# Patient Record
Sex: Male | Born: 1990
Health system: Southern US, Community
[De-identification: ages and names within clinical notes are randomized; demographics above are authoritative.]

## PROBLEM LIST (undated history)

## (undated) DIAGNOSIS — M24411 Recurrent dislocation, right shoulder: Secondary | ICD-10-CM

## (undated) DIAGNOSIS — F419 Anxiety disorder, unspecified: Secondary | ICD-10-CM

## (undated) DIAGNOSIS — F329 Major depressive disorder, single episode, unspecified: Secondary | ICD-10-CM

## (undated) DIAGNOSIS — J45909 Unspecified asthma, uncomplicated: Secondary | ICD-10-CM

## (undated) DIAGNOSIS — F32A Depression, unspecified: Secondary | ICD-10-CM

## (undated) HISTORY — DX: Anxiety disorder, unspecified: F41.9

## (undated) HISTORY — DX: Major depressive disorder, single episode, unspecified: F32.9

## (undated) HISTORY — DX: Depression, unspecified: F32.A

---
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
## 2012-07-12 ENCOUNTER — Emergency Department (HOSPITAL_COMMUNITY)
Admission: EM | Admit: 2012-07-12 | Discharge: 2012-07-13 | Disposition: A | Discharge status: Home / Self Care | Payer: Self-pay | Attending: Emergency Medicine | Admitting: Emergency Medicine

## 2012-07-12 ENCOUNTER — Emergency Department (HOSPITAL_COMMUNITY): Payer: Self-pay

## 2012-07-12 ENCOUNTER — Encounter (HOSPITAL_COMMUNITY): Payer: Self-pay | Admitting: Emergency Medicine

## 2012-07-12 DIAGNOSIS — F17200 Nicotine dependence, unspecified, uncomplicated: Secondary | ICD-10-CM | POA: Insufficient documentation

## 2012-07-12 DIAGNOSIS — J069 Acute upper respiratory infection, unspecified: Secondary | ICD-10-CM | POA: Insufficient documentation

## 2012-07-12 DIAGNOSIS — J40 Bronchitis, not specified as acute or chronic: Secondary | ICD-10-CM | POA: Insufficient documentation

## 2012-07-12 HISTORY — DX: Unspecified asthma, uncomplicated: J45.909

## 2012-07-12 MED ORDER — ALBUTEROL SULFATE HFA 108 (90 BASE) MCG/ACT IN AERS
2.0000 | INHALATION_SPRAY | RESPIRATORY_TRACT | Status: AC
  Administered 2012-07-13: 2 via RESPIRATORY_TRACT
  Filled 2012-07-12: qty 6.7

## 2012-07-12 NOTE — ED Notes (Signed)
States he has run out of his inhaler and does not have any more medication.  Thinks weather change, and working outside, blowing leaves, dust etc has contributed.  States temp 99+ during day and goes away when he is back inside

## 2012-07-12 NOTE — ED Notes (Signed)
Patient c/o nasal congestion with cough x 1 week.  States had productive cough.

## 2012-07-13 MED ORDER — BENZONATATE 100 MG PO CAPS: 100.0000 mg | ORAL_CAPSULE | Freq: Three times a day (TID) | ORAL | Status: DC | PRN

## 2012-07-13 NOTE — ED Provider Notes (Signed)
History     CSN: [id number]  Arrival date & time 07/12/12  2225   First MD Initiated Contact with Patient 07/12/12 2307      Chief Complaint  Patient presents with  . Nasal Congestion  . Cough    HPI Pt was seen at 2330.   Per pt, c/o gradual onset and persistence of constant runny/stuffy nose, sinus congestion, and cough for the past 1 week.  Tmax "99."  Denies sore throat, no rash, no CP/SOB, no N/V/D, no abd pain.     Past Medical History  Diagnosis Date  . Asthma     History reviewed. No pertinent past surgical history.    History  Substance Use Topics  . Smoking status: Current Every Day Smoker  . Smokeless tobacco: Not on file  . Alcohol Use: Yes     Review of Systems ROS: Statement: All systems negative except as marked or noted in the HPI; Constitutional: Negative for fever and chills. ; ; Eyes: Negative for eye pain, redness and discharge. ; ; ENMT: Negative for ear pain, hoarseness, sore throat.  +nasal congestion, sinus pressure. ; ; Cardiovascular: Negative for chest pain, palpitations, diaphoresis, dyspnea and peripheral edema. ; ; Respiratory: +cough. Negative for wheezing and stridor. ; ; Gastrointestinal: Negative for nausea, vomiting, diarrhea, abdominal pain, blood in stool, hematemesis, jaundice and rectal bleeding. ; ; Genitourinary: Negative for dysuria, flank pain and hematuria. ; ; Musculoskeletal: Negative for back pain and neck pain. Negative for swelling and trauma.; ; Skin: Negative for pruritus, rash, abrasions, blisters, bruising and skin lesion.; ; Neuro: Negative for headache, lightheadedness and neck stiffness. Negative for weakness, altered level of consciousness , altered mental status, extremity weakness, paresthesias, involuntary movement, seizure and syncope.       Allergies  Penicillins  Home Medications  No current outpatient prescriptions on file.  BP 133/69  Pulse 80  Temp 98.3 F (36.8 C) (Oral)  Resp 22  Ht 5\' 7"  (1.702  m)  Wt 165 lb (74.844 kg)  BMI 25.84 kg/m2  SpO2 100%  Physical Exam 2335: Physical examination:  Nursing notes reviewed; Vital signs and O2 SAT reviewed;  Constitutional: Well developed, Well nourished, Well hydrated, In no acute distress; Head:  Normocephalic, atraumatic; Eyes: EOMI, PERRL, No scleral icterus; ENMT: TM's clear bilat.  +edemetous nasal turbinates bilat with clear rhinorrhea. Mouth and pharynx normal, Mucous membranes moist; Neck: Supple, Full range of motion, No lymphadenopathy; Cardiovascular: Regular rate and rhythm, No murmur, rub, or gallop; Respiratory: Breath sounds clear & equal bilaterally, No rales, rhonchi, wheezes.  Speaking full sentences with ease, Normal respiratory effort/excursion; Chest: Nontender, Movement normal;; Extremities: Pulses normal, No tenderness, No edema, No calf edema or asymmetry.; Neuro: AA&Ox3, Major CN grossly intact.  Speech clear. No gross focal motor or sensory deficits in extremities.; Skin: Color normal, Warm, Dry, no rash.   ED Course  Procedures    MDM  MDM Reviewed: nursing note and vitals Interpretation: x-ray     Dg Chest 2 View 07/13/2012  *RADIOLOGY REPORT*  Clinical Data: Cough with nasal congestion.  CHEST - 2 VIEW  Comparison: None.  Findings: Normal heart size with clear lung fields.  No bony abnormality.  IMPRESSION: Negative chest.   Original Report Authenticated By: Elsie Stain, M.D.       0045:   No pneumonia on CXR.  Will tx symptomatically for URI/bronchitis.  No wheezing on exam.  MDI given teach/treat here.  Lungs continue CTA bilat without wheezing, resps easy,  Sats 100% R/A.  Dx testing d/w pt.  Questions answered.  Verb understanding, agreeable to d/c home with outpt f/u.      Laray Anger, DO 07/16/12 1427

## 2014-07-28 ENCOUNTER — Emergency Department (HOSPITAL_COMMUNITY)
Admission: EM | Admit: 2014-07-28 | Discharge: 2014-07-29 | Disposition: A | Discharge status: Home / Self Care | Payer: No Typology Code available for payment source | Attending: Emergency Medicine | Admitting: Emergency Medicine

## 2014-07-28 ENCOUNTER — Emergency Department (HOSPITAL_COMMUNITY): Payer: No Typology Code available for payment source

## 2014-07-28 ENCOUNTER — Encounter (HOSPITAL_COMMUNITY): Payer: Self-pay | Admitting: Emergency Medicine

## 2014-07-28 DIAGNOSIS — M62838 Other muscle spasm: Secondary | ICD-10-CM | POA: Insufficient documentation

## 2014-07-28 DIAGNOSIS — J45909 Unspecified asthma, uncomplicated: Secondary | ICD-10-CM | POA: Insufficient documentation

## 2014-07-28 DIAGNOSIS — S161XXA Strain of muscle, fascia and tendon at neck level, initial encounter: Secondary | ICD-10-CM

## 2014-07-28 DIAGNOSIS — Z880 Allergy status to penicillin: Secondary | ICD-10-CM | POA: Insufficient documentation

## 2014-07-28 DIAGNOSIS — Z720 Tobacco use: Secondary | ICD-10-CM | POA: Insufficient documentation

## 2014-07-28 DIAGNOSIS — Y9389 Activity, other specified: Secondary | ICD-10-CM | POA: Insufficient documentation

## 2014-07-28 DIAGNOSIS — S134XXA Sprain of ligaments of cervical spine, initial encounter: Secondary | ICD-10-CM | POA: Insufficient documentation

## 2014-07-28 DIAGNOSIS — S0990XA Unspecified injury of head, initial encounter: Secondary | ICD-10-CM | POA: Insufficient documentation

## 2014-07-28 DIAGNOSIS — Y92410 Unspecified street and highway as the place of occurrence of the external cause: Secondary | ICD-10-CM | POA: Insufficient documentation

## 2014-07-28 MED ORDER — DIAZEPAM 5 MG PO TABS
5.0000 mg | ORAL_TABLET | Freq: Once | ORAL | Status: AC
  Administered 2014-07-29: 5 mg via ORAL
  Filled 2014-07-28: qty 1

## 2014-07-28 MED ORDER — KETOROLAC TROMETHAMINE 60 MG/2ML IM SOLN
60.0000 mg | Freq: Once | INTRAMUSCULAR | Status: AC
  Administered 2014-07-29: 60 mg via INTRAMUSCULAR
  Filled 2014-07-28: qty 2

## 2014-07-28 NOTE — ED Notes (Signed)
C-collar applied in triage.
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
## 2014-07-29 MED ORDER — DIAZEPAM 5 MG PO TABS: 5.0000 mg | ORAL_TABLET | Freq: Three times a day (TID) | ORAL | Status: DC | PRN

## 2014-07-29 MED ORDER — OXYCODONE-ACETAMINOPHEN 5-325 MG PO TABS
1.0000 | ORAL_TABLET | Freq: Once | ORAL | Status: AC
  Administered 2014-07-29: 1 via ORAL
  Filled 2014-07-29: qty 1

## 2014-07-29 MED ORDER — OXYCODONE-ACETAMINOPHEN 5-325 MG PO TABS: 1.0000 | ORAL_TABLET | ORAL | Status: DC | PRN

## 2014-07-29 MED ORDER — PREDNISONE 10 MG PO TABS: 40.0000 mg | ORAL_TABLET | Freq: Every day | ORAL | Status: AC

## 2014-07-29 NOTE — ED Notes (Signed)
Pt alert & oriented x4, stable gait. Patient given discharge instructions, paperwork & prescription(s). Patient  instructed to stop at the registration desk to finish any additional paperwork. Patient verbalized understanding. Pt left department w/ no further questions. 

## 2014-07-29 NOTE — ED Provider Notes (Signed)
Medical screening examination/treatment/procedure(s) were performed by non-physician practitioner and as supervising physician I was immediately available for consultation/collaboration.   EKG Interpretation None        Hanley SeamenJohn L Ellakate Gonsalves, MD 07/29/14 [phone]
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
## 2014-07-29 NOTE — Discharge Instructions (Signed)
Cervical Sprain °A cervical sprain is an injury in the neck in which the strong, fibrous tissues (ligaments) that connect your neck bones stretch or tear. Cervical sprains can range from mild to severe. Severe cervical sprains can cause the neck vertebrae to be unstable. This can lead to damage of the spinal cord and can result in serious nervous system problems. The amount of time it takes for a cervical sprain to get better depends on the cause and extent of the injury. Most cervical sprains heal in 1 to 3 weeks. °CAUSES  °Severe cervical sprains may be caused by:  °· Contact sport injuries (such as from football, rugby, wrestling, hockey, auto racing, gymnastics, diving, martial arts, or boxing).   °· Motor vehicle collisions.   °· Whiplash injuries. This is an injury from a sudden forward and backward whipping movement of the head and neck.  °· Falls.   °Mild cervical sprains may be caused by:  °· Being in an awkward position, such as while cradling a telephone between your ear and shoulder.   °· Sitting in a chair that does not offer proper support.   °· Working at a poorly designed computer station.   °· Looking up or down for long periods of time.   °SYMPTOMS  °· Pain, soreness, stiffness, or a burning sensation in the front, back, or sides of the neck. This discomfort may develop immediately after the injury or slowly, 24 hours or more after the injury.   °· Pain or tenderness directly in the middle of the back of the neck.   °· Shoulder or upper back pain.   °· Limited ability to move the neck.   °· Headache.   °· Dizziness.   °· Weakness, numbness, or tingling in the hands or arms.   °· Muscle spasms.   °· Difficulty swallowing or chewing.   °· Tenderness and swelling of the neck.   °DIAGNOSIS  °Most of the time your health care provider can diagnose a cervical sprain by taking your history and doing a physical exam. Your health care provider will ask about previous neck injuries and any known neck  problems, such as arthritis in the neck. X-rays may be taken to find out if there are any other problems, such as with the bones of the neck. Other tests, such as a CT scan or MRI, may also be needed.  °TREATMENT  °Treatment depends on the severity of the cervical sprain. Mild sprains can be treated with rest, keeping the neck in place (immobilization), and pain medicines. Severe cervical sprains are immediately immobilized. Further treatment is done to help with pain, muscle spasms, and other symptoms and may include: °· Medicines, such as pain relievers, numbing medicines, or muscle relaxants.   °· Physical therapy. This may involve stretching exercises, strengthening exercises, and posture training. Exercises and improved posture can help stabilize the neck, strengthen muscles, and help stop symptoms from returning.   °HOME CARE INSTRUCTIONS  °· Put ice on the injured area.   °¨ Put ice in a plastic bag.   °¨ Place a towel between your skin and the bag.   °¨ Leave the ice on for 15-20 minutes, 3-4 times a day.   °· If your injury was severe, you may have been given a cervical collar to wear. A cervical collar is a two-piece collar designed to keep your neck from moving while it heals. °¨ Do not remove the collar unless instructed by your health care provider. °¨ If you have long hair, keep it outside of the collar. °¨ Ask your health care provider before making any adjustments to your collar. Minor   adjustments may be required over time to improve comfort and reduce pressure on your chin or on the back of your head. °¨ If you are allowed to remove the collar for cleaning or bathing, follow your health care provider's instructions on how to do so safely. °¨ Keep your collar clean by wiping it with mild soap and water and drying it completely. If the collar you have been given includes removable pads, remove them every 1-2 days and hand wash them with soap and water. Allow them to air dry. They should be completely  dry before you wear them in the collar. °¨ If you are allowed to remove the collar for cleaning and bathing, wash and dry the skin of your neck. Check your skin for irritation or sores. If you see any, tell your health care provider. °¨ Do not drive while wearing the collar.   °· Only take over-the-counter or prescription medicines for pain, discomfort, or fever as directed by your health care provider.   °· Keep all follow-up appointments as directed by your health care provider.   °· Keep all physical therapy appointments as directed by your health care provider.   °· Make any needed adjustments to your workstation to promote good posture.   °· Avoid positions and activities that make your symptoms worse.   °· Warm up and stretch before being active to help prevent problems.   °SEEK MEDICAL CARE IF:  °· Your pain is not controlled with medicine.   °· You are unable to decrease your pain medicine over time as planned.   °· Your activity level is not improving as expected.   °SEEK IMMEDIATE MEDICAL CARE IF:  °· You develop any bleeding. °· You develop stomach upset. °· You have signs of an allergic reaction to your medicine.   °· Your symptoms get worse.   °· You develop new, unexplained symptoms.   °· You have numbness, tingling, weakness, or paralysis in any part of your body.   °MAKE SURE YOU:  °· Understand these instructions. °· Will watch your condition. °· Will get help right away if you are not doing well or get worse. °Document Released: 08/08/2007 Document Revised: 10/16/2013 Document Reviewed: 04/18/2013 °[employer]® Patient Information ©2015 [employer]. This information is not intended to replace advice given to you by your health care provider. Make sure you discuss any questions you have with your health care provider. ° °Motor Vehicle Collision °It is common to have multiple bruises and sore muscles after a motor vehicle collision (MVC). These tend to feel worse for the first 24 hours. You may have  the most stiffness and soreness over the first several hours. You may also feel worse when you wake up the first morning after your collision. After this point, you will usually begin to improve with each day. The speed of improvement often depends on the severity of the collision, the number of injuries, and the location and nature of these injuries. °HOME CARE INSTRUCTIONS °· Put ice on the injured area. °· Put ice in a plastic bag. °· Place a towel between your skin and the bag. °· Leave the ice on for 15-20 minutes, 3-4 times a day, or as directed by your health care provider. °· Drink enough fluids to keep your urine clear or pale yellow. Do not drink alcohol. °· Take a warm shower or bath once or twice a day. This will increase blood flow to sore muscles. °· You may return to activities as directed by your caregiver. Be careful when lifting, as this may aggravate neck or back   pain. °· Only take over-the-counter or prescription medicines for pain, discomfort, or fever as directed by your caregiver. Do not use aspirin. This may increase bruising and bleeding. °SEEK IMMEDIATE MEDICAL CARE IF: °· You have numbness, tingling, or weakness in the arms or legs. °· You develop severe headaches not relieved with medicine. °· You have severe neck pain, especially tenderness in the middle of the back of your neck. °· You have changes in bowel or bladder control. °· There is increasing pain in any area of the body. °· You have shortness of breath, light-headedness, dizziness, or fainting. °· You have chest pain. °· You feel sick to your stomach (nauseous), throw up (vomit), or sweat. °· You have increasing abdominal discomfort. °· There is blood in your urine, stool, or vomit. °· You have pain in your shoulder (shoulder strap areas). °· You feel your symptoms are getting worse. °MAKE SURE YOU: °· Understand these instructions. °· Will watch your condition. °· Will get help right away if you are not doing well or get  worse. °Document Released: 10/11/2005 Document Revised: 02/25/2014 Document Reviewed: 03/10/2011 °[employer]® Patient Information ©2015 [employer]. This information is not intended to replace advice given to you by your health care provider. Make sure you discuss any questions you have with your health care provider. ° °Muscle Strain °A muscle strain is an injury that occurs when a muscle is stretched beyond its normal length. Usually a small number of muscle fibers are torn when this happens. Muscle strain is rated in degrees. First-degree strains have the least amount of muscle fiber tearing and pain. Second-degree and third-degree strains have increasingly more tearing and pain.  °Usually, recovery from muscle strain takes 1-2 weeks. Complete healing takes 5-6 weeks.  °CAUSES  °Muscle strain happens when a sudden, violent force placed on a muscle stretches it too far. This may occur with lifting, sports, or a fall.  °RISK FACTORS °Muscle strain is especially common in athletes.  °SIGNS AND SYMPTOMS °At the site of the muscle strain, there may be: °· Pain. °· Bruising. °· Swelling. °· Difficulty using the muscle due to pain or lack of normal function. °DIAGNOSIS  °Your health care provider will perform a physical exam and ask about your medical history. °TREATMENT  °Often, the best treatment for a muscle strain is resting, icing, and applying cold compresses to the injured area.   °HOME CARE INSTRUCTIONS  °· Use the PRICE method of treatment to promote muscle healing during the first 2-3 days after your injury. The PRICE method involves: °¨ Protecting the muscle from being injured again. °¨ Restricting your activity and resting the injured body part. °¨ Icing your injury. To do this, put ice in a plastic bag. Place a towel between your skin and the bag. Then, apply the ice and leave it on from 15-20 minutes each hour. After the third day, switch to moist heat packs. °¨ Apply compression to the injured area with a  splint or elastic bandage. Be careful not to wrap it too tightly. This may interfere with blood circulation or increase swelling. °¨ Elevate the injured body part above the level of your heart as often as you can. °· Only take over-the-counter or prescription medicines for pain, discomfort, or fever as directed by your health care provider. °· Warming up prior to exercise helps to prevent future muscle strains. °SEEK MEDICAL CARE IF:  °· You have increasing pain or swelling in the injured area. °· You have numbness, tingling, or a   significant loss of strength in the injured area. °MAKE SURE YOU:  °· Understand these instructions. °· Will watch your condition. °· Will get help right away if you are not doing well or get worse. °Document Released: 10/11/2005 Document Revised: 08/01/2013 Document Reviewed: 05/10/2013 °[employer]® Patient Information ©2015 [employer]. This information is not intended to replace advice given to you by your health care provider. Make sure you discuss any questions you have with your health care provider. ° °

## 2015-04-23 ENCOUNTER — Encounter (HOSPITAL_COMMUNITY): Payer: Self-pay | Admitting: Emergency Medicine

## 2015-04-23 ENCOUNTER — Emergency Department (HOSPITAL_COMMUNITY)
Admission: EM | Admit: 2015-04-23 | Discharge: 2015-04-23 | Disposition: A | Discharge status: Home / Self Care | Payer: Self-pay | Attending: Emergency Medicine | Admitting: Emergency Medicine

## 2015-04-23 DIAGNOSIS — Z720 Tobacco use: Secondary | ICD-10-CM | POA: Insufficient documentation

## 2015-04-23 DIAGNOSIS — Z880 Allergy status to penicillin: Secondary | ICD-10-CM | POA: Insufficient documentation

## 2015-04-23 DIAGNOSIS — F419 Anxiety disorder, unspecified: Secondary | ICD-10-CM | POA: Insufficient documentation

## 2015-04-23 DIAGNOSIS — J45909 Unspecified asthma, uncomplicated: Secondary | ICD-10-CM | POA: Insufficient documentation

## 2015-04-23 MED ORDER — HYDROXYZINE HCL 25 MG PO TABS: 25.0000 mg | ORAL_TABLET | Freq: Four times a day (QID) | ORAL | Status: DC | PRN

## 2015-04-23 NOTE — ED Notes (Signed)
MD at bedside. 

## 2015-04-23 NOTE — ED Notes (Signed)
Having pain in lower abdomen for one month.  Having only watery bowel movements.  Rates pain 7/10.

## 2015-04-23 NOTE — Discharge Instructions (Signed)
Panic Attacks °Panic attacks are sudden, short-lived surges of severe anxiety, fear, or discomfort. They may occur for no reason when you are relaxed, when you are anxious, or when you are sleeping. Panic attacks may occur for a number of reasons:  °· Healthy people occasionally have panic attacks in extreme, life-threatening situations, such as war or natural disasters. Normal anxiety is a protective mechanism of the body that helps us react to danger (fight or flight response). °· Panic attacks are often seen with anxiety disorders, such as panic disorder, social anxiety disorder, generalized anxiety disorder, and phobias. Anxiety disorders cause excessive or uncontrollable anxiety. They may interfere with your relationships or other life activities. °· Panic attacks are sometimes seen with other mental illnesses, such as depression and posttraumatic stress disorder. °· Certain medical conditions, prescription medicines, and drugs of abuse can cause panic attacks. °SYMPTOMS  °Panic attacks start suddenly, peak within 20 minutes, and are accompanied by four or more of the following symptoms: °· Pounding heart or fast heart rate (palpitations). °· Sweating. °· Trembling or shaking. °· Shortness of breath or feeling smothered. °· Feeling choked. °· Chest pain or discomfort. °· Nausea or strange feeling in your stomach. °· Dizziness, light-headedness, or feeling like you will faint. °· Chills or hot flushes. °· Numbness or tingling in your lips or hands and feet. °· Feeling that things are not real or feeling that you are not yourself. °· Fear of losing control or going crazy. °· Fear of dying. °Some of these symptoms can mimic serious medical conditions. For example, you may think you are having a heart attack. Although panic attacks can be very scary, they are not life threatening. °DIAGNOSIS  °Panic attacks are diagnosed through an assessment by your health care provider. Your health care provider will ask  questions about your symptoms, such as where and when they occurred. Your health care provider will also ask about your medical history and use of alcohol and drugs, including prescription medicines. Your health care provider may order blood tests or other studies to rule out a serious medical condition. Your health care provider may refer you to a mental health professional for further evaluation. °TREATMENT  °· Most healthy people who have one or two panic attacks in an extreme, life-threatening situation will not require treatment. °· The treatment for panic attacks associated with anxiety disorders or other mental illness typically involves counseling with a mental health professional, medicine, or a combination of both. Your health care provider will help determine what treatment is best for you. °· Panic attacks due to physical illness usually go away with treatment of the illness. If prescription medicine is causing panic attacks, talk with your health care provider about stopping the medicine, decreasing the dose, or substituting another medicine. °· Panic attacks due to alcohol or drug abuse go away with abstinence. Some adults need professional help in order to stop drinking or using drugs. °HOME CARE INSTRUCTIONS  °· Take all medicines as directed by your health care provider.   °· Schedule and attend follow-up visits as directed by your health care provider. It is important to keep all your appointments. °SEEK MEDICAL CARE IF: °· You are not able to take your medicines as prescribed. °· Your symptoms do not improve or get worse. °SEEK IMMEDIATE MEDICAL CARE IF:  °· You experience panic attack symptoms that are different than your usual symptoms. °· You have serious thoughts about hurting yourself or others. °· You are taking medicine for panic attacks and   have a serious side effect. °MAKE SURE YOU: °· Understand these instructions. °· Will watch your condition. °· Will get help right away if you are not  doing well or get worse. °Document Released: 10/11/2005 Document Revised: 10/16/2013 Document Reviewed: 05/25/2013 °ExitCare® Patient Information ©2015 [employer]. This information is not intended to replace advice given to you by your health care provider. Make sure you discuss any questions you have with your health care provider. ° °Emergency Department Resource Guide °1) Find a Doctor and Pay Out of Pocket °Although you won't have to find out who is covered by your insurance plan, it is a good idea to ask around and get recommendations. You will then need to call the office and see if the doctor you have chosen will accept you as a new patient and what types of options they offer for patients who are self-pay. Some doctors offer discounts or will set up payment plans for their patients who do not have insurance, but you will need to ask so you aren't surprised when you get to your appointment. ° °2) Contact Your Local Health Department °Not all health departments have doctors that can see patients for sick visits, but many do, so it is worth a call to see if yours does. If you don't know where your local health department is, you can check in your phone book. The CDC also has a tool to help you locate your state's health department, and many state websites also have listings of all of their local health departments. ° °3) Find a Walk-in Clinic °If your illness is not likely to be very severe or complicated, you may want to try a walk in clinic. These are popping up all over the country in pharmacies, drugstores, and shopping centers. They're usually staffed by nurse practitioners or physician assistants that have been trained to treat common illnesses and complaints. They're usually fairly quick and inexpensive. However, if you have serious medical issues or chronic medical problems, these are probably not your best option. ° °No Primary Care Doctor: °- Call [facility] at  832-8000 - they can help you  locate a primary care doctor that  accepts your insurance, provides certain services, etc. °- Physician Referral Service- 1-800-533-3463 ° °Chronic Pain Problems: °Organization         Address  Phone   Notes  °[facility]  (336) 297-2271 Patients need to be referred by their primary care doctor.  ° °Medication Assistance: °Organization         Address  Phone   Notes  °[place] [facility] [address] °[place], [place] [address] (336) 641-8030 --Must be a resident of [place] °-- Must have NO insurance coverage whatsoever (no Medicaid/ Medicare, etc.) °-- The pt. MUST have a primary care doctor that directs their care regularly and follows them in the community °  °[facility]  (866) 331-1348   °[employer]  (888) 892-1162   ° °Agencies that provide inexpensive medical care: °Organization         Address  Phone   Notes  °[facility]  (336) 832-8035   °[facility]    (336) 832-7272   °[facility] [address] °[place], [place] [address] (336) 832-4777   °[facility] [address], ° (336) 271-4999   °[facility]    (336) 373-0678   °[facility]    (336) 272-1050   °  [facility] ° [address], [place] Phone:  (336) 832-4444, Fax:  (336) 832-4440 Hours of Operation:  9 am - 6 pm, M-F.  Also accepts Medicaid/Medicare and self-pay.  ° [facility] ° [address], [place] Phone: (336) 832-3150, Fax: (336) 832-3151. Hours of Operation:  8:30 am - 5:30 pm, M-F.  Also accepts Medicaid and self-pay.  °[facility] [place] [address], [place] Phone: (336) 878-6027   °[facility] [address], [place], [place] (336)723-1848, Ext. 123 Mondays & Thursdays: 7-9 AM.  First 15 patients are seen on a first come, first serve basis. °  ° °Medicaid-accepting [place]  Providers: ° °Organization         Address  Phone   Notes  °[facility] [address], [place] (336) 641-2100 Also accepts self-pay patients.  °[facility] [address], [place] ° (336) 856-9996   °[facility] [address], [place] (336) 288-8857   °[facility] [address] [place] [address], [place] (336) 299-7000   °Veita Bland [address], [place]  ° (336) 373-1557 Only accepts [place] Access Medicaid patients after they have their name applied to their card.  ° °Self-Pay (no insurance) in [place]: ° °Organization         Address  Phone   Notes  °Sickle Cell Patients, [facility] [address], [place] (336) 832-1970   °[facility] [address], [place] (336) 832-4400   °[facility] ° [address], [place] (336) 992-4800   °[facility]/Dr. Osei-Bonsu ° [address] [place] [address], [place] or [address], [place] (336) 841-8500 Phone number for both [place] and [place] locations is the same.  °[facility] [address], [place] (336) 299-0000   °[facility] [address] [place] [address], [place] or [address] (336) 852-7530 °(336) 878-2260   °[facility] [address], [place] (336) 350-1642, phone; (336) 294-5005, fax Sees patients 1st and 3rd Saturday of every month.  Must not qualify for public or private insurance (i.e. Medicaid, Medicare, [place] Health Choice, Veterans' Benefits) • Household income should be no more than 200% of the poverty level •The clinic cannot treat you if you are pregnant or think you are pregnant • Sexually transmitted diseases are not treated at the clinic.  ° ° °Dental Care: °Organization         Address  Phone  Notes  °[facility] [address], [place] (336) 641-6152 Accepts children up to age 21 who are enrolled in Medicaid or [place] Health Choice; pregnant women with a Medicaid card; and children who have applied for Medicaid or [place] Health Choice, but were declined, whose parents can pay a reduced fee at time of service.  °[facility] [place]  [address], [place] (336) 641-7733 Accepts children up to age 21 who are enrolled in Medicaid or [place] Health Choice; pregnant women with a Medicaid card; and children who have applied for Medicaid or  Health Choice, but were declined, whose parents can pay a reduced fee at time of service.  °[facility] ° [address], [place] (336) 641-4533 Patients are seen by appointment only. Walk-ins are not accepted. [facility] will see patients 18 years of age and older. °Monday - Tuesday (8am-5pm) °Most Wednesdays (8:30-5pm) °$30 per visit, cash only  °[facility] ° [address], [place] (336) 641-4533 Patients are seen by appointment only. Walk-ins are not accepted. [facility] will see patients 18 years of age and older. °One Wednesday Evening (Monthly: Volunteer Based).  $30 per visit, cash only  °[facility]  (919) 537-3737 for adults; Children under age 4, call Graduate Pediatric Dentistry at (919) [phone]. Children aged 4-14, please call (919) [phone] to request a pediatric application. ° Dental services are provided in all areas of dental care including fillings, crowns and bridges, complete and partial dentures, implants, gum treatment, root canals, and extractions. Preventive care is also provided. Treatment is provided to both adults and children. °Patients are selected via a lottery and there is often a waiting list. °  °[facility] [address], °[place] ° (336) 763-8833 [email or web address] °  °[facility]  [address], [place], [place] (336)723-1848, Ext. 123 Second and Fourth Thursday of each month, opens at 6:30 AM; Clinic ends at 9 AM.  Patients are seen on a first-come first-served basis, and a limited number are seen during each clinic.  ° °[facility] ° [address], [place], [place] (336) 723-7904   Eligibility Requirements °You must have lived in [place], [place], or [place] counties for at least the last three months. °  You cannot be eligible for state or federal sponsored healthcare insurance, including Veterans Administration, Medicaid, or Medicare. °  You generally cannot be eligible for healthcare insurance through your employer.  °  How to apply: °Eligibility screenings are held every Tuesday and Wednesday afternoon from 1:00 pm until 4:00 pm. You do not need an appointment for the interview!  °[facility] [address], [place], [place] 336-631-2330   °[facility]  336-342-8273   °[facility]  336-703-3100   °[facility]  336-570-6415   ° °Behavioral Health Resources in the Community: °Intensive Outpatient Programs °Organization         Address  Phone  Notes  °[facility] [address], [place], [place] 336-878-6098   °[facility] [address], [place], [place] 336-832-9800   °[facility] [address], [place], [place] ° 336-882-2125   °[facility] [address],  °[place], [place] 1-800-853-5163 or 336-641-4981   °Substance Abuse Resources °Organization         Address  Phone  Notes  °[facility]  336-882-2125   °[facility]  336-784-9470   °[facility]  336-285-9073   °[facility]  336-845-3988   °Residential & Outpatient Substance Abuse Program  1-800-659-3381   °Psychological Services °Organization         Address  Phone  Notes  ° Health  336- 832-9600     °[facility]  336- 378-7881   °[facility] [address], [place] 1-800-853-5163 or 336-641-4981   ° °Mobile Crisis Teams °Organization         Address  Phone  Notes  °[facility], Mobile Crisis Care Unit  1-877-626-1772   °Assertive °[facility] ° [address] [place], [place] 336-834-9664   °  Sharon DeEsch [address] °[place] 336-554-5454   ° °Self-Help/Support Groups °Organization         Address  Phone             Notes  °[facility] [place] - variety of support groups  336- 373-1402 Call for more information  °Narcotics Anonymous (NA), [facility] [address], °[place]  2 meetings at this location  ° °Residential Treatment Programs °Organization         Address  Phone  Notes  °[facility] [address],    °[place]  1-866-801-8205   °[facility] ° [address], [place], [place] 704-293-8524   °[facility] [address], [place] 336-845-3988 Admissions: 8am-3pm M-F  °[facility] [address]    °[place], [place] 336-841-1104   °[facility] [address], [place], [place] 336-379-7146   °[facility] [address]  °[place], [place] 336-285-9073   °[facility] - Intensive Outpatient [address], [place], [place] 336-852-3033   °[facility] [address]  °[place], [place] 1-877-615-2722 or 336-784-9470   °[facility] [address] [place], [place] 336-227-7417 Accepts Medicaid  °[facility] [address]  °[place] 1-800-659-3381 Substance Abuse/Addiction Treatment  ° °[place] Behavioral Health Resources °Organization         Address  Phone  Notes  °[facility]  (888) 581-9988   °Julie Brannon, PhD [address] [place], [place]   (336) 349-5553 or (336) 951-0000    °[facility]   [address] °[place], [place] (336) 349-4454   °[facility] [address], [place], [place] (336) 342-8316 Insurance/Medicaid/sponsorship through [facility]  °[facility] [address]                                    [place], [place] (336) 342-8316 Therapy/tele-psych/case  °[facility] [address]  ° [place], [place] (336) 349-2233    °Dr. Arfeen  (336) 349-4544   °[facility] [place]  [employer] [place] [facility] 1) [address], [place] °2) [address], [place] °3)  [address], [place] (336) 349-3220 °(336) 342-7768 ° °(336) 342-8140   °[place] [facility] (336) 342-1394 or (336) 342-3537 (After Hours)    ° ° ° °

## 2015-04-23 NOTE — ED Notes (Signed)
Pt c/o recent and worsening anxiety and panic attacks. Pt states he has not been medicated since he was 14. Denies si/hi. nad noted.
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
## 2015-04-30 ENCOUNTER — Ambulatory Visit (INDEPENDENT_AMBULATORY_CARE_PROVIDER_SITE_OTHER): Payer: Self-pay | Admitting: Family Medicine

## 2015-04-30 VITALS — BP 132/70 | HR 80 | Temp 99.2°F | Resp 16 | Ht 68.0 in | Wt 156.0 lb

## 2015-04-30 DIAGNOSIS — F329 Major depressive disorder, single episode, unspecified: Secondary | ICD-10-CM

## 2015-04-30 DIAGNOSIS — F418 Other specified anxiety disorders: Secondary | ICD-10-CM

## 2015-04-30 DIAGNOSIS — F419 Anxiety disorder, unspecified: Principal | ICD-10-CM

## 2015-04-30 MED ORDER — ALPRAZOLAM 0.25 MG PO TABS: 0.2500 mg | ORAL_TABLET | Freq: Two times a day (BID) | ORAL | Status: DC | PRN

## 2015-04-30 MED ORDER — PAROXETINE HCL 20 MG PO TABS: 20.0000 mg | ORAL_TABLET | Freq: Every day | ORAL | Status: DC

## 2015-04-30 NOTE — Patient Instructions (Addendum)
I am sorry that your are having such a terrible time.   Let's try some medication- paxil is for depression and anxiety.  Take it once a day The xanax is to use in case of a panic attack- this is a potentially habit forming medication so do not use unless needed Please call or send me an email in a week or so and let me know how you are doing.

## 2015-04-30 NOTE — Progress Notes (Signed)
[facility] [address], [place] [address] [phone] [phone]  Date:  04/30/2015   Name:  Jeffrey Conway   DOB:  [birth year]   MRN:  [id number]  PCP:  No PCP Per Patient    Chief Complaint: Depression and Anxiety   History of Present Illness:  Jeffrey Conway is a 24 y.o. very pleasant male patient who presents with the following:  He was in the ER a week ago with anxiety/ panic attack.  He is going through a custody battle with his child's mother. He has other stressors in his life- his father and GM are both sick.  He was treated with atarax and was asked to follow-up so he came here to see [place] tonight.  "I feel like I've had a terrible run of horrible luck. I need some kind of help." He has been having trouble for about the last 5 years, although he did go through some counseling in his earlier teen years as well.  Jeffrey Conway has had a hard life- he reports having virtually no contact with his father (who has a drug problem) until a couple of years ago.  They are trying to re-establish a relationship but his dad is using again.  "his girlfriend called me just today and told me that she found him unconscious at home.  I told her that I can't deal with this right now and not to call me."    Over the last year he has not been able to compensate for his symptoms of depression and anxiety.  He feels like he has both depression and anxiety- he has read a lot about these issues as paying for medical care is a hardship for him.    He notes a down and blue mood, lack of interest in his favorite activities.  He has been an avid Psychologist, clinical but just does not feel like playing over the last several months.  He does not feel like going out or being social.  Poor energy, but he is going to work.  He does work in whatever he is able to find.    His daughter is an infant- her name is Jeffrey Conway.  She will be 36 months old soon.  Her mother has an issue with addiction and is in treatment.  Pt  does not speak with her much.  Jeffrey Conway has full parental rights, but MOB's grandmother currently has the child and is making it hard for him to see his child.  Per Jeffrey Conway's report the grandmother also raised her own grand-daughter, the child's mother and knows all about her drug issues; she assumes that Antron must also be involved in drugs.    He has not seen his daughter in 4 months- this is really hard for him.  He is working with a Child psychotherapist and also trying to find a Clinical research associate that he can afford.  "the grandmother has a lot more money that I do and can afford better lawyers."  He sleeps poorly sometimes, sometimes he oversleeps.   Appetite is down- he has lost weight  He denies any drug use He did use wellbutrin at 24 years old- he notes that it made him feel strange so he stopped using it.  He is willing to try something else at this point  Wt Readings from Last 3 Encounters:  04/30/15 156 lb (70.761 kg)  04/23/15 170 lb (77.111 kg)  07/28/14 170 lb (77.111 kg)     There are no  active problems to display for this patient.   Past Medical History  Diagnosis Date  . Asthma   . Depression   . Anxiety     History reviewed. No pertinent past surgical history.  History  Substance Use Topics  . Smoking status: Current Every Day Smoker    Types: Cigarettes  . Smokeless tobacco: Not on file  . Alcohol Use: No    Family History  Problem Relation Age of Onset  . Cancer Mother   . Heart disease Mother   . Stroke Mother   . Mental illness Mother   . Cancer Father   . Heart disease Father   . Stroke Father   . Mental illness Father     Allergies  Allergen Reactions  . Penicillins     Medication list has been reviewed and updated.  Current Outpatient Prescriptions on File Prior to Visit  Medication Sig Dispense Refill  . benzonatate (TESSALON) 100 MG capsule Take 1 capsule (100 mg total) by mouth 3 (three) times daily as needed for cough. (Patient not taking: Reported  on 04/30/2015) 20 capsule 0  . diazepam (VALIUM) 5 MG tablet Take 1 tablet (5 mg total) by mouth every 8 (eight) hours as needed for muscle spasms. (Patient not taking: Reported on 04/30/2015) 15 tablet 0  . hydrOXYzine (ATARAX/VISTARIL) 25 MG tablet Take 1 tablet (25 mg total) by mouth every 6 (six) hours as needed for anxiety. (Patient not taking: Reported on 04/30/2015) 20 tablet 0  . oxyCODONE-acetaminophen (PERCOCET/ROXICET) 5-325 MG per tablet Take 1 tablet by mouth every 4 (four) hours as needed. (Patient not taking: Reported on 04/30/2015) 20 tablet 0   No current facility-administered medications on file prior to visit.    Review of Systems:  As per HPI- otherwise negative.   Physical Examination: Filed Vitals:   04/30/15 1811  BP: 132/70  Pulse: 80  Temp: 99.2 F (37.3 C)  Resp: 16   Filed Vitals:   04/30/15 1811  Height:  (1.727 m)  Weight: 156 lb (70.761 kg)   Body mass index is 23.73 kg/(m^2). Ideal Body Weight: Weight in (lb) to have BMI = 25: 164.1  GEN: WDWN, NAD, Non-toxic, A & O x 3, appears upset, sometimes tearful HEENT: Atraumatic, Normocephalic. Neck supple. No masses, No LAD. Ears and Nose: No external deformity. CV: RRR, No M/G/R. No JVD. No thrill. No extra heart sounds. PULM: CTA B, no wheezes, crackles, rhonchi. No retractions. No resp. distress. No accessory muscle use. EXTR: No c/c/e NEURO Normal gait.  PSYCH: Normally interactive. Conversant. Not depressed or anxious appearing.  Calm demeanor.   Looked pt up in controlled substance database and there are no entries for him Assessment and Plan: Anxiety and depression - Plan: PARoxetine (PAXIL) 20 MG tablet, ALPRAZolam (XANAX) 0.25 MG tablet  Here today with anxiety and depression.  He is under tremendous stress in dealing with his family issues and his current custody battle. He would like to try an SSRI- choose paxil which we hope will offer additional benefit in dealing with his anxiety.   Discussed short term use of a prn benzo as well.  He ended up in the ER with his sx a week ago, and would like to have something on hand to take if he has a panic attack.  I think this is reasonable but cautioned him to minimize use as this is an addictive medication He agrees to give me an update soon as to his progress  Meds ordered this encounter  Medications  . PARoxetine (PAXIL) 20 MG tablet    Sig: Take 1 tablet (20 mg total) by mouth daily.    Dispense:  30 tablet    Refill:  3  . ALPRAZolam (XANAX) 0.25 MG tablet    Sig: Take 1 tablet (0.25 mg total) by mouth 2 (two) times daily as needed for anxiety.    Dispense:  15 tablet    Refill:  0      Signed Abbe AmsterdamJessica Herbert Marken, MD
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
## 2015-12-11 ENCOUNTER — Ambulatory Visit (INDEPENDENT_AMBULATORY_CARE_PROVIDER_SITE_OTHER): Payer: Commercial Managed Care - HMO | Admitting: Physician Assistant

## 2015-12-11 VITALS — BP 110/68 | HR 62 | Temp 97.9°F | Resp 16 | Ht 68.0 in | Wt 158.2 lb

## 2015-12-11 DIAGNOSIS — S161XXA Strain of muscle, fascia and tendon at neck level, initial encounter: Secondary | ICD-10-CM

## 2015-12-11 DIAGNOSIS — K0889 Other specified disorders of teeth and supporting structures: Secondary | ICD-10-CM | POA: Diagnosis not present

## 2015-12-11 MED ORDER — OXYCODONE-ACETAMINOPHEN 5-325 MG PO TABS: 1.0000 | ORAL_TABLET | Freq: Four times a day (QID) | ORAL | Status: DC | PRN

## 2015-12-11 MED ORDER — DIAZEPAM 5 MG PO TABS: 5.0000 mg | ORAL_TABLET | Freq: Three times a day (TID) | ORAL | Status: DC | PRN
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
## 2015-12-11 NOTE — Patient Instructions (Addendum)
Apply a warm compress to the neck muscle to help them relax.    NO Tylenol (acetaminophen) while you are taking the Percocet (it has Tylenol in it already). You may take Aleve (naproxen) or ibuprofen if needed. Take with food to reduce the risk of stomach upset.  Once you are over the acute pain, and have the tooth addressed, consider re-evaluation of the neck pain that you have chronically.

## 2015-12-18 ENCOUNTER — Ambulatory Visit (INDEPENDENT_AMBULATORY_CARE_PROVIDER_SITE_OTHER): Payer: Commercial Managed Care - HMO | Admitting: Family Medicine

## 2015-12-18 ENCOUNTER — Ambulatory Visit (INDEPENDENT_AMBULATORY_CARE_PROVIDER_SITE_OTHER): Payer: Commercial Managed Care - HMO

## 2015-12-18 VITALS — BP 124/82 | HR 86 | Temp 98.5°F | Resp 16 | Ht 68.0 in | Wt 157.0 lb

## 2015-12-18 DIAGNOSIS — M542 Cervicalgia: Secondary | ICD-10-CM | POA: Diagnosis not present

## 2015-12-18 DIAGNOSIS — K0889 Other specified disorders of teeth and supporting structures: Secondary | ICD-10-CM

## 2015-12-18 DIAGNOSIS — M5410 Radiculopathy, site unspecified: Secondary | ICD-10-CM

## 2015-12-18 DIAGNOSIS — M792 Neuralgia and neuritis, unspecified: Secondary | ICD-10-CM | POA: Diagnosis not present

## 2015-12-18 DIAGNOSIS — S161XXD Strain of muscle, fascia and tendon at neck level, subsequent encounter: Secondary | ICD-10-CM | POA: Diagnosis not present

## 2015-12-18 DIAGNOSIS — S161XXA Strain of muscle, fascia and tendon at neck level, initial encounter: Secondary | ICD-10-CM

## 2015-12-18 MED ORDER — LIDOCAINE VISCOUS 2 % MT SOLN: OROMUCOSAL | Status: DC

## 2015-12-18 MED ORDER — HYDROCODONE-ACETAMINOPHEN 5-325 MG PO TABS: 1.0000 | ORAL_TABLET | Freq: Three times a day (TID) | ORAL | Status: DC | PRN

## 2015-12-18 MED ORDER — IBUPROFEN 600 MG PO TABS: 600.0000 mg | ORAL_TABLET | Freq: Three times a day (TID) | ORAL | Status: DC | PRN

## 2015-12-18 MED ORDER — CYCLOBENZAPRINE HCL 5 MG PO TABS: 5.0000 mg | ORAL_TABLET | Freq: Three times a day (TID) | ORAL | Status: DC | PRN

## 2015-12-18 NOTE — Patient Instructions (Addendum)
Because you received an x-ray today, you will receive an invoice from [facility]. Please contact [facility] at [phone] with questions or concerns regarding your invoice. Our billing staff will not be able to assist you with those questions.   RESOURCE GUIDE  Chronic Pain Problems:  Contact [facility] [phone]  Patients need to be referred by their primary care doctor.  Insufficient Money for Medicine:  Contact United Way: call [phone]  No Primary Care Doctor:  Call Health Connect [phone] - can help you locate a primary care doctor that accepts your insurance, provides certain services, etc.  Physician Referral Service- [phone] Agencies that provide inexpensive medical care:  [facility] [phone]  [facility] [phone]  [facility] [phone]  [facility] [phone]  Planned Parenthood [phone]  [facility] [phone] Medicaid-accepting [facility] Providers:  [facility]- [address] [phone], Mon-Fri 9am-7pm, Sat 9am-1pm  [facility]- [address] [place], Suite [place] [phone]  [facility]- [address] [place] [phone]  [facility]- [address] [phone]  Renaye Rakers- [address] [place], [address], [phone] Only accepts [place] patients after they have their name applied to their card  Self Pay (no insurance) in [facility]:  Sickle Cell Patients - [facility] [address] [place], [phone]  [facility]gent Care- [address] [place] [phone]  [facility] [place]- [address]  - [facility]- see information above (Speak to [employer] if you do not have insurance)  - [facility]- [address], [phone]  - [facility]- [address] [phone]  - Dr  Julio Sicks- [address], [place], [phone]  - [facility] - [address] [phone]  - [facility]- [address] [phone], also [address], [phone]  - [facility]- [address] [place], [phone], 1st & 3rd Saturday  every month, 10am-1pm  - [facility]  [address] [place], [place].  Phone: [phone], Fax: [phone]. Hours of Operation: 9 am - 6 pm, M-F.  - [facility]  [address], [place]  Phone: [phone], Fax: [phone]. Hours of Operation: 8:30 am - 5:30 pm, M-F.  [facility]  [address]  [place], [place] [address]  [phone]  [facility]  [address]  [place], [place] [address]  [phone]  1) Find a Doctor and Pay Out of Pocket  Although you won't have to find out who is covered by your insurance plan, it is a good idea to ask around and get recommendations. You will then need to call the office and see if the doctor you have chosen will accept you as a new patient and what types of options they offer for patients who are self-pay. Some doctors offer discounts or will set up payment plans for their patients who do not have insurance, but you will need to ask so you aren't surprised when you get to your appointment.  2) Contact Your Local Health Department  Not all health departments have doctors that can see patients for sick visits, but many do, so it is worth a call to see if yours does. If  you don't know where your local health department is, you can check in your phone book. The CDC also has a tool to help you locate your state's health department, and many state websites also have listings of all of their local health departments.  3) Find a Walk-in Clinic  If your illness is not likely to be very severe or complicated, you may want to try a walk in clinic. These are popping up all  over the country in pharmacies, drugstores, and shopping centers. They're usually staffed by nurse practitioners or physician assistants that have been trained to treat common illnesses and complaints. They're usually fairly quick and inexpensive. However, if you have serious medical issues or chronic medical problems, these are probably not your best option  STD Testing  [facility] Department of [facility] [place], STD Clinic, [address], [place], phone [phone] or [phone]. Monday - Friday, call for an appointment.  [facility] Department of [employer], STD Clinic, [place] [address], [place], phone [phone] or [phone]. Monday - Friday, call for an appointment. Abuse/Neglect:  [facility] Child Abuse Hotline 7087724434  [facility] Child Abuse Hotline 646-011-0243 (After Hours) Emergency Shelter: [facility] [phone]  Maternity Homes:  Room at the [place] [facility] [phone]  [facility] [phone] MRSA Hotline #: 717-882-5297  Dental Assistance  If unable to pay or uninsured, contact: [facility]. to become qualified for the adult dental clinic.  Patients with Medicaid: [facility]  [phone] [address], [phone]  [address], [phone]  If unable to pay, or uninsured, contact [facility] [phone] in [place], [phone] in [facility]) to become qualified for the adult dental clinic  [facility]  [address]  [place], [place] [address]  [phone]  [email or web address].drcivils.[email or web address]  Other Proofreader Services:  [facility]- [address] [place], [place], [place], [address], [phone], Ext. [phone], 2nd and 4th Thursday of the month at 6:30am. 10 clients each day by appointment, can sometimes see walk-in patients if someone does not show for an appointment.  [facility]- [address] [place], [place], [address], [phone]  [facility] [address] [place], [place], [address], [phone]  [facility] Health Department- [phone]  [facility] Health Department- [phone]  [facility] Health [phone] Behavioral Health Resources in the [facility]  Intensive Outpatient Programs:  [facility]  [address]  [place], [place]  [phone]  Both a day and evening program  [facility]  [address]  [place], [place] [address]  [phone]  ADS: Alcohol & Drug Svcs  [address]  [place]  [phone]  [facility] Mental Health  ACCESS LINE: 4755440530 or [phone]  [address]  [place], [place] [address]  EntrepreneurLoan.[email or web address]  Substance Abuse Resources:  Alcohol and Drug Services 223-404-9751  [facility] 954-625-4142  [facility] [phone]  Floydene Flock [phone]  Residential & Outpatient Substance Abuse Program (386)857-1750 Psychological Services:  [facility] [phone]  [facility] [phone]  [facility], [phone] [place]. [address], [place], ACCESS LINE: (559)627-7142 or [phone], Ent[employer]urLoan.co.[email or web address] Mobile Crisis Teams:  [facility]  Mobile Crisis Care Unit  (938)246-6667  Assertive  Psychotherapeutic Services  [address] [place]in[place] Otto  [phone]  Interventionist  [address] [place]  [address]  [place]  [place]  [phone]  Self-Help/Support Groups:  Mental Health Assoc. of [employer] of support groups  (810)[phone] (call for more info)  Narcotics Anonymous (NA)  [facility]aring [facility]  [address]  [place] - 2 meetings at this location  Residential Treatment Programs:  [facility]  [address]  [place]  [phone]  [facility]  [address] [place] [address]  [place], [place] [address]  [phone]  [facility]  [address] [place], [place] [address]  [phone]  Admissions: 8am-3pm M-F  [facility]  [address]  [place], [place] [address]  [phone]  [facility]  [address]  [place], [place]  [phone]  [facility]  [address]  [place], [place]  [phone]  [facility] - Intensive Outpatient  [address]  [place], [place]  [phone]  [facility] (Addiction Recovery Care [facility]oc.)  [address]  [place], [place]  [phone] or [phone]  Residential Treatment Services (RTS), Medicaid  [address]  [place], [place]  [phone]  Fellowship [address]  [address]  [place]  [phone]  [facility] Resources:  [phone]  General Therapy  Angie Fava, PhD  [address] [place], [place] [address]  [phone]  Insurance  [facility] Behavioral  [address]  [place], [place] [address]  [phone]  [facility] Recovery  [address] [place], [place] [address]  [phone]  Insurance/Medicaid/sponsorship through [facility] and Families  [address]  [place], [place] [address]  Therapy/tele-psych/case  [phone]  [facility]  [address], [place] [address]  Adolescent/group home/case management  [phone]  Creola Corn PhD  General therapy  Insurance  [phone]  Dr. Lolly Mustache, [place], M-F  [phone]  [facility] United Way [facility] Dept.  [address] [place] [address]  Blondell Reveal  Phone: [phone] Phone: [phone] Phone: [phone]  [facility], [phone]  [facility] - [facility]- [phone] - [facility] in [place], [address]  [phone], Insurance  [place] Child Abuse Hotline  651-174-9438 or [phone] (After Hours)

## 2015-12-18 NOTE — Progress Notes (Signed)
Chief Complaint:  Chief Complaint  Patient presents with  . Dental Pain  . Neck Pain    about a week and a half    HPI: Jeffrey Conway is a 25 y.o. male who [facility] to [facility] complaining of needing medication refills for dental pain, he had a broken tooth and was given PErcocet 5/325 ,g #60 7 days ago and has run out, this was for tooth and neck pain. He still has valium. Marland Kitchen  He has a dental appt next month, he has a dental appt on 01/16/2016 , he does not know the name for sure " he did not make the appt, his mom made the appt".  He does not have that much money right now for dental appt, needs to find dental appt that takes insurance He has been dealing with pain in his neck since 2015. HE may have strained it while working the date after his tooth broke, carrying the copper roll at work with coworker and has hurt his neck.  He has very limited ROM in it. He has mid neck pain, middle of shoulder blade. He can't pick up stuff.  Dent Max insurance, appt is not this week but actually next month to be  in network   Last  Imaging study of c spine  :  IMPRESSION: 1. Normal CT appearance of the head. 2. No acute fracture traumatic injury to the cervical spine. Some straightening of the cervical spine is expected within a hard collar.   Electronically Signed  By: Gennette Pac M.D.  On: 07/29/2014 01:15   Past Medical History  Diagnosis Date  . Asthma   . Depression   . Anxiety    History reviewed. No pertinent past surgical history. Social History   Social History  . Marital Status: Single    Spouse Name: n/a  . Number of Children: 1  . Years of Education: HS Diploma   Occupational History  . electrician    Social History Main Topics  . Smoking status: Current Every Day Smoker -- 1.00 packs/day    Types: Cigarettes  . Smokeless tobacco: Never Used  . Alcohol Use: No  . Drug Use: No  . Sexual Activity:    Partners: Female   Other Topics Concern  .  None   Social History Narrative   Lives with his mother.   Father is not in contact.   His daughter lives with her mother, they split up fall 2016.   Family History  Problem Relation Age of Onset  . Cancer Mother     skin  . Heart disease Mother   . Cancer Father     throat  . Heart disease Father   . Polycystic ovary syndrome Sister   . Mental illness Brother     depression as a teenager   Allergies  Allergen Reactions  . Penicillins    Prior to Admission medications   Medication Sig Start Date End Date Taking? Authorizing Provider  diazepam (VALIUM) 5 MG tablet Take 1 tablet (5 mg total) by mouth every 8 (eight) hours as needed for muscle spasms. 12/11/15  Yes Chelle Jeffery, PA-C  oxyCODONE-acetaminophen (PERCOCET) 5-325 MG tablet Take 1-2 tablets by mouth every 6 (six) hours as needed for severe pain. Patient not taking: Reported on 12/18/2015 12/11/15   Porfirio Oar, PA-C     ROS: The patient denies fevers, chills, night sweats, unintentional weight loss, chest pain, palpitations, wheezing, dyspnea on exertion, nausea, vomiting,  abdominal pain, dysuria, hematuria, melena, + n/w/t  All other systems have been reviewed and were otherwise negative with the exception of those mentioned in the HPI and as above.    PHYSICAL EXAM: Filed Vitals:   12/18/15 1309  BP: 124/82  Pulse: 86  Temp: 98.5 F (36.9 C)  Resp: 16   Body mass index is 23.88 kg/(m^2).   General: Alert,mild distress, he is very flat in his affect HEENT:  Normocephalic, atraumatic, oropharynx patent. EOMI, Pinpoint pupils Cardiovascular:  Regular rate and rhythm, no rubs murmurs or gallops.  No Carotid bruits, radial pulse intact. No pedal edema.  Respiratory: Clear to auscultation bilaterally.  No wheezes, rales, or rhonchi.  No cyanosis, no use of accessory musculature Abdominal: No organomegaly, abdomen is soft and non-tender, positive bowel sounds. No masses. Skin: No rashes. Neurologic: Facial  musculature symmetric. Psychiatric: Flat affect with signs of anxiety when talking about pain and UDS, overly concerned that meds will not cover pain, he is preseverating about what he needs to do if the meds I give him today does not cover his pain,  Lymphatic: No cervical or submandibular lymphadenopathy Musculoskeletal: Gait intact. No edema, tenderness Decrease ROM, poor exam due to pain Good grip strength Sensation intact bilateral arms   LABS: No results found for this or any previous visit.   EKG/XRAY:   Primary read [facility]. Neg for fracture or dislocation    ASSESSMENT/PLAN: Encounter Diagnoses  Name Primary?  . Pain, dental Yes  . Cervical strain, acute, initial encounter   . Neck pain    Patient agreed to UDS, he was given percocets #60 and has taken all of them as "prescribed", he still has about 15 of his valium left from last OV  I am concerned about overuse and dependece in s omeone so young with acute on chronic neck problems. He would like referral to a specialist I will refer him to neurosurgery or to ortho spine, whichever can get him in to see them the fastest He cont to have dental pain. I do not feel he needs to have percocets for dental pain, if an upper molar dental cary or root canal is needed then he needs to get it pulled.  I have given him sliding scale and free clinic resources, mom per the patient states that she has looked into getting him a dental appt that will be covered by insurance He has reinjured his neck , xrays of c spine show no acute changes [place] DB pulled, no illegal activities . Patient states he needs to go back to work, no light duty , has family to support, has no drug dependence issues. He did admit to using recreational marijuana 2 weeks ago but no other illicit drugs.  OOW note given until Monday MEdications rx today : Norco 5/325 mg #30, Flexeril 5 mg TID prn #30, Lidocaine 2% viscous gel  Prn , Ibuprofen 600 mg  TID prn  Consider prednisone pack if regiment does not work  Gross sideeffects, risk and benefits, and alternatives of medications d/w patient. Patient is aware that all medications have potential sideeffects and we are unable to predict every sideeffect or drug-drug interaction that may occur.  Thao Le DO  12/18/2015 1:45 PM

## 2015-12-24 LAB — PRESCRIPTION MONITORING PROFILE (9 PANEL)
Amphetamine/Meth: NEGATIVE ng/mL
Barbiturate Screen, Urine: NEGATIVE ng/mL
Cocaine Metabolites: NEGATIVE ng/mL
Creatinine, Urine: 203.84 mg/dL
Methadone Screen, Urine: NEGATIVE ng/mL
Nitrites, Initial: NEGATIVE ug/mL
Propoxyphene: NEGATIVE ng/mL
pH, Initial: 5.5 pH (ref 4.5–8.9)

## 2015-12-24 LAB — BENZODIAZEPINES (GC/LC/MS), URINE
Alprazolam metabolite (GC/LC/MS), ur confirm: NEGATIVE ng/mL
Clonazepam metabolite (GC/LC/MS), ur confirm: NEGATIVE ng/mL
Flurazepam metabolite (GC/LC/MS), ur confirm: NEGATIVE ng/mL
Lorazepam (GC/LC/MS), ur confirm: NEGATIVE ng/mL
Midazolam (GC/LC/MS), ur confirm: NEGATIVE ng/mL
Nordiazepam (GC/LC/MS), ur confirm: 771 ng/mL — AB
Oxazepam (GC/LC/MS), ur confirm: 3053 ng/mL — AB
Temazepam (GC/LC/MS), ur confirm: 1713 ng/mL — AB
Triazolam metabolite (GC/LC/MS), ur confirm: NEGATIVE ng/mL

## 2015-12-24 LAB — OPIATES/OPIOIDS (LC/MS-MS)
Codeine Urine: NEGATIVE ng/mL
Hydrocodone: 9449 ng/mL — AB
Hydromorphone: 866 ng/mL — AB
Morphine Urine: NEGATIVE ng/mL
Norhydrocodone, Ur: >10000 ng/mL — AB
Noroxycodone, Ur: NEGATIVE ng/mL
Oxycodone, ur: NEGATIVE ng/mL
Oxymorphone: 155 ng/mL — AB

## 2015-12-24 LAB — CANNABANOIDS (GC/LC/MS), URINE: THC-COOH (GC/LC/MS), ur confirm: 134 ng/mL — AB

## 2015-12-24 LAB — OXYCODONE, URINE (LC/MS-MS)
Noroxycodone, Ur: NEGATIVE ng/mL
Oxycodone, ur: NEGATIVE ng/mL
Oxymorphone: 155 ng/mL — AB

## 2016-01-07 ENCOUNTER — Encounter: Payer: Self-pay | Admitting: Family Medicine

## 2017-04-08 ENCOUNTER — Encounter (HOSPITAL_COMMUNITY): Payer: Self-pay | Admitting: Emergency Medicine

## 2017-04-08 ENCOUNTER — Emergency Department (HOSPITAL_COMMUNITY)
Admission: EM | Admit: 2017-04-08 | Discharge: 2017-04-08 | Disposition: A | Discharge status: Home / Self Care | Payer: Self-pay | Attending: Emergency Medicine | Admitting: Emergency Medicine

## 2017-04-08 DIAGNOSIS — J45909 Unspecified asthma, uncomplicated: Secondary | ICD-10-CM | POA: Insufficient documentation

## 2017-04-08 DIAGNOSIS — F419 Anxiety disorder, unspecified: Secondary | ICD-10-CM | POA: Insufficient documentation

## 2017-04-08 DIAGNOSIS — F17210 Nicotine dependence, cigarettes, uncomplicated: Secondary | ICD-10-CM | POA: Insufficient documentation

## 2017-04-08 DIAGNOSIS — Z79899 Other long term (current) drug therapy: Secondary | ICD-10-CM | POA: Insufficient documentation

## 2017-04-08 DIAGNOSIS — K029 Dental caries, unspecified: Secondary | ICD-10-CM | POA: Insufficient documentation

## 2017-04-08 HISTORY — DX: Recurrent dislocation, right shoulder: M24.411

## 2017-04-08 MED ORDER — LORAZEPAM 1 MG PO TABS
1.0000 mg | ORAL_TABLET | Freq: Once | ORAL | Status: AC
  Administered 2017-04-08: 1 mg via ORAL
  Filled 2017-04-08: qty 1

## 2017-04-08 MED ORDER — CHLORHEXIDINE GLUCONATE 0.12% ORAL RINSE (MEDLINE KIT): 15.0000 mL | Freq: Two times a day (BID) | OROMUCOSAL | 0 refills | Status: AC

## 2017-04-08 MED ORDER — LORAZEPAM 1 MG PO TABS: 1.0000 mg | ORAL_TABLET | Freq: Three times a day (TID) | ORAL | 0 refills | Status: DC | PRN

## 2017-04-08 NOTE — Discharge Instructions (Signed)
As discussed, it is important that you follow-up with both our primary care colleagues, and our dentist for further evaluation and management of your condition. Please take all medication as directed, and return here for concerning changes.

## 2017-04-08 NOTE — ED Triage Notes (Signed)
Patient complaining of dental pain "for two years constantly. I've been taking 6-8 500 mg tylenol every day for two years and my mom thinks I'm yellow colored." I also have trouble urinating. It's been going on for a long time. I feel like I have to go but I'm not able to go."

## 2017-04-08 NOTE — ED Provider Notes (Signed)
[facility] DEPT Provider Note   CSN: [id number] Arrival date & time: 04/08/17  1355     History   Chief Complaint Chief Complaint  Patient presents with  . Multiple Complaints    HPI Jeffrey Conway is a 26 y.o. male.  HPI  Young male presents with multiple complaints Essentially the patient has complaints of 3 areas.  Eventually becomes clear the patient presents today due to difficulty with initiating a urinary stream. It seems as though over the past months, patient has had hesitancy with urination, described as anxiousness, requiring substantial amounts of time and/or turning on water faucet urinate. Earlier today the patient was attempting to provide a drug screen sample for a new job, was unable to do so. He denies substantial back pain, abdominal pain, dysuria when he goes, hematuria. He also has no fever, chills, vomiting. Secondary concern is ongoing dental discomfort. Patient notes multiple cavities, K throughout his teeth. Patient has not seen a dentist in years, notes that over the past months, 3 or more, he has had increasing pain primarily on the right side of his mouth.  Tertiary concern is anxiousness. Patient notes he has had anxiety, depression since he was a youth. He has tried multiple medication, some with transient relief including benzodiazepines, but he is not currently taking any medication. He is here with his sister. He has not attempted to see a primary care physician, nor a dentist in some time.      Past Medical History:  Diagnosis Date  . Anxiety   . Asthma   . Depression   . Shoulder dislocation, recurrent, right     There are no active problems to display for this patient.   History reviewed. No pertinent surgical history.     Home Medications    Prior to Admission medications   Medication Sig Start Date End Date Taking? Authorizing Provider  acetaminophen (TYLENOL) 500 MG tablet Take 1,500 mg by mouth every 4 (four)  hours as needed for moderate pain.   Yes [provider]  chlorhexidine gluconate, MEDLINE KIT, (PERIDEX) 0.12 % solution Use as directed 15 mLs in the mouth or throat 2 (two) times daily. 04/08/17   Carmin Muskrat, MD  LORazepam (ATIVAN) 1 MG tablet Take 1 tablet (1 mg total) by mouth 3 (three) times daily as needed for anxiety. 04/08/17   Carmin Muskrat, MD    Family History Family History  Problem Relation Age of Onset  . Cancer Mother        skin  . Heart disease Mother   . Cancer Father        throat  . Heart disease Father   . Polycystic ovary syndrome Sister   . Mental illness Brother        depression as a teenager    Social History Social History  Substance Use Topics  . Smoking status: Current Every Day Smoker    Packs/day: 1.00    Types: Cigarettes  . Smokeless tobacco: Never Used  . Alcohol use No     Allergies   Penicillins   Review of Systems Review of Systems  Constitutional:       Per HPI, otherwise negative  HENT:       Per HPI, otherwise negative  Respiratory:       Per HPI, otherwise negative  Cardiovascular:       Per HPI, otherwise negative  Gastrointestinal: Negative for vomiting.  Endocrine:       Negative aside from HPI  Genitourinary:       Neg aside from HPI   Musculoskeletal:       Per HPI, otherwise negative  Skin: Negative.   Neurological: Negative for syncope.  Psychiatric/Behavioral: Positive for dysphoric mood. The patient is nervous/anxious.      Physical Exam Updated Vital Signs BP 135/77 (BP Location: Right Arm)   Pulse 86   Temp 98.8 F (37.1 C) (Oral)   Resp 16   Ht 5' 9"  (1.753 m)   Wt 74.8 kg (165 lb)   SpO2 100%   BMI 24.37 kg/m   Physical Exam  Constitutional: He is oriented to person, place, and time. He appears well-developed. No distress.  HENT:  Head: Normocephalic and atraumatic.  Multiple dental caries, broken teeth, erosion at the gumline, evidence for gingivitis, no bleeding, purulent  discharge from any teeth, or area along the gums.   Eyes: Conjunctivae and EOM are normal.  Cardiovascular: Normal rate and regular rhythm.   Pulmonary/Chest: Effort normal. No stridor. No respiratory distress.  Abdominal: He exhibits no distension. There is no tenderness.  Musculoskeletal: He exhibits no edema.  Neurological: He is alert and oriented to person, place, and time.  Skin: Skin is warm and dry.  Psychiatric: His mood appears anxious.  Nursing note and vitals reviewed.    ED Treatments / Results   Procedures Procedures (including critical care time)  Medications Ordered in ED Medications  LORazepam (ATIVAN) tablet 1 mg (not administered)     Initial Impression / Assessment and Plan / ED Course  I have reviewed the triage vital signs and the nursing notes.  Pertinent labs & imaging results that were available during my care of the patient were reviewed by me and considered in my medical decision making (see chart for details).  Young male presents with multiple concerns. Here he is awake, alert, afebrile, speaking clearly, moving all extremity spontaneously, with soft, non-peritoneal abdomen, no evidence for oropharyngeal abscesses, no respiratory compromise. I spent a considerable amount of time with the patient discussing the need for primary care and dental follow-up. We discussed options for short course of medication for anxiety. Patient indicates that he has had suicidal ideation previously with SSRI. We agreed a short course of benzodiazepine will be initiated, and he will follow-up with our local clinic for further management. With no evidence for bacteremia, sepsis, distress, patient is appropriate for further evaluation, management as an outpatient.    Final Clinical Impressions(s) / ED Diagnoses   Final diagnoses:  Dental caries  Anxiety    New Prescriptions New Prescriptions   CHLORHEXIDINE GLUCONATE, MEDLINE KIT, (PERIDEX) 0.12 % SOLUTION    Use  as directed 15 mLs in the mouth or throat 2 (two) times daily.   LORAZEPAM (ATIVAN) 1 MG TABLET    Take 1 tablet (1 mg total) by mouth 3 (three) times daily as needed for anxiety.     Carmin Muskrat, MD 04/08/17 [phone]

## 2017-04-10 ENCOUNTER — Emergency Department (HOSPITAL_COMMUNITY)
Admission: EM | Admit: 2017-04-10 | Discharge: 2017-04-11 | Disposition: A | Discharge status: Home / Self Care | Payer: Self-pay | Attending: Emergency Medicine | Admitting: Emergency Medicine

## 2017-04-10 ENCOUNTER — Encounter (HOSPITAL_COMMUNITY): Payer: Self-pay

## 2017-04-10 DIAGNOSIS — K029 Dental caries, unspecified: Secondary | ICD-10-CM | POA: Insufficient documentation

## 2017-04-10 DIAGNOSIS — R3911 Hesitancy of micturition: Secondary | ICD-10-CM

## 2017-04-10 DIAGNOSIS — R300 Dysuria: Secondary | ICD-10-CM | POA: Insufficient documentation

## 2017-04-10 NOTE — ED Provider Notes (Signed)
[facility] DEPT Provider Note   CSN: [id number] Arrival date & time: 04/10/17  2327  By signing my name below, I, Margit Banda, attest that this documentation has been prepared under the direction and in the presence of Kyon Bentler, Annie Main, MD. Electronically Signed: Margit Banda, ED Scribe. 04/11/17. 12:11 AM.  History   Chief Complaint Chief Complaint  Patient presents with  . Dental Pain  . Dysuria    HPI Jeffrey Conway is a 26 y.o. male who presents to the Emergency Department complaining of urinary hesitancy for the last year. He was seen at [facility] on 04/08/17 for same sx. He was referred to a urologist and was given anxiety medication. Pt notes that the medication has mildly helped. Pt states he doesn't have health insurance so he is unable to see the urologist. He drinks more caffeinated drinks than water. Pt denies dysurias, SOB, hematuria, constipation  Additionally, pt c/o of lower dental pain that has been on going for about a year as well. Pt reports missing teeth. Hasn't seen a dentist.   The history is provided by the patient. No language interpreter was used.    Past Medical History:  Diagnosis Date  . Anxiety   . Asthma   . Depression   . Shoulder dislocation, recurrent, right     There are no active problems to display for this patient.   History reviewed. No pertinent surgical history.     Home Medications    Prior to Admission medications   Medication Sig Start Date End Date Taking? Authorizing Provider  acetaminophen (TYLENOL) 500 MG tablet Take 1,500 mg by mouth every 4 (four) hours as needed for moderate pain.   Yes [provider]  LORazepam (ATIVAN) 1 MG tablet Take 1 tablet (1 mg total) by mouth 3 (three) times daily as needed for anxiety. 04/08/17  Yes Carmin Muskrat, MD  chlorhexidine gluconate, MEDLINE KIT, (PERIDEX) 0.12 % solution Use as directed 15 mLs in the mouth or throat 2 (two) times daily. 04/08/17   Carmin Muskrat, MD     Family History Family History  Problem Relation Age of Onset  . Cancer Mother        skin  . Heart disease Mother   . Cancer Father        throat  . Heart disease Father   . Polycystic ovary syndrome Sister   . Mental illness Brother        depression as a teenager    Social History Social History  Substance Use Topics  . Smoking status: Current Every Day Smoker    Packs/day: 1.00    Types: Cigarettes  . Smokeless tobacco: Never Used  . Alcohol use No     Allergies   Penicillins   Review of Systems Review of Systems  A complete 10 system review of systems was obtained and all systems are negative except as noted in the HPI and PMH.   Physical Exam Updated Vital Signs BP 131/81   Pulse 68   Temp 99.2 F (37.3 C)   Resp 16   SpO2 100%   Physical Exam  Constitutional: He is oriented to person, place, and time. He appears well-developed and well-nourished. No distress.  Appears anxious.    HENT:  Head: Normocephalic and atraumatic.  Mouth/Throat: Oropharynx is clear and moist. No oropharyngeal exudate.  Multiple caries along lower teeth. No abscess. Floor of Mouth is soft.   Eyes: Conjunctivae and EOM are normal. Pupils are equal, round, and reactive to  light.  Neck: Normal range of motion. Neck supple.  No meningismus.  Cardiovascular: Normal rate, regular rhythm, normal heart sounds and intact distal pulses.   No murmur heard. Pulmonary/Chest: Effort normal and breath sounds normal. No respiratory distress.  Abdominal: Soft. There is no tenderness. There is no rebound and no guarding.  No abdominal tenderness.  Genitourinary:  Genitourinary Comments: No testicle tenderness.  Musculoskeletal: Normal range of motion. He exhibits no edema or tenderness.  Neurological: He is alert and oriented to person, place, and time. No cranial nerve deficit. He exhibits normal muscle tone. Coordination normal.   5/5 strength throughout. CN 2-12 intact.Equal grip  strength.   Skin: Skin is warm.  Psychiatric: He has a normal mood and affect. His behavior is normal.  Nursing note and vitals reviewed.    ED Treatments / Results  DIAGNOSTIC STUDIES: Oxygen Saturation is 100% on RA, normal by my interpretation.   COORDINATION OF CARE: 12:11 AM-Discussed next steps with pt. Pt verbalized understanding and is agreeable with the plan.   Labs (all labs ordered are listed, but only abnormal results are displayed) Labs Reviewed  URINALYSIS, ROUTINE W REFLEX MICROSCOPIC - Abnormal; Notable for the following:       Result Value   Leukocytes, UA TRACE (*)    Bacteria, UA RARE (*)    All other components within normal limits  CBC WITH DIFFERENTIAL/PLATELET - Abnormal; Notable for the following:    WBC 10.6 (*)    HCT 38.4 (*)    All other components within normal limits  COMPREHENSIVE METABOLIC PANEL - Abnormal; Notable for the following:    Glucose, Bld 101 (*)    AST 14 (*)    ALT 10 (*)    All other components within normal limits  URINE CULTURE  CK  GC/CHLAMYDIA PROBE [facility] ([place]) NOT AT [facility]    EKG  EKG Interpretation None       Radiology No results found.  Procedures Procedures (including critical care time)  Medications Ordered in ED Medications - No data to display   Initial Impression / Assessment and Plan / ED Course  I have reviewed the triage vital signs and the nursing notes.  Pertinent labs & imaging results that were available during my care of the patient were reviewed by me and considered in my medical decision making (see chart for details).     Ongoing dental pain as well as urinary hesitancy for several months.  Poor PO intake of fluids. Works in [employer].  Dentition poor but no evidence of abscess or ludwig's angina.  1:08 AM Bladder scan 220 ml Labs normal including CK and creatinine.   2:36 AM Was able to urinate. Antibiotics for teeth. No fever or vomiting. No penile discharge. He is  ready to be discharged home.  Patient reassured.  Anxiety may be playing a role. Increase hydration, follow up with urology. Return precautions discussed.  Final Clinical Impressions(s) / ED Diagnoses   Final diagnoses:  Urinary hesitancy  Dental caries    New Prescriptions New Prescriptions   No medications on file   I personally performed the services described in this documentation, which was scribed in my presence. The recorded information has been reviewed and is accurate.    Ezequiel Essex, MD 04/11/17 [phone]

## 2017-04-10 NOTE — ED Triage Notes (Signed)
Pt complaining of urinary hesitancy. Pt denies any pain or burning with urination. Pt also complaining of lower dental pain. Pt states multiple missing teeth.

## 2017-04-11 LAB — CBC WITH DIFFERENTIAL/PLATELET
BASOS PCT: 0 %
Basophils Absolute: 0 10*3/uL (ref 0.0–0.1)
EOS ABS: 0.3 10*3/uL (ref 0.0–0.7)
EOS PCT: 3 %
HCT: 38.4 % — ABNORMAL LOW (ref 39.0–52.0)
Hemoglobin: 13.5 g/dL (ref 13.0–17.0)
Lymphocytes Relative: 37 %
Lymphs Abs: 3.9 10*3/uL (ref 0.7–4.0)
MCH: 30.3 pg (ref 26.0–34.0)
MCHC: 35.2 g/dL (ref 30.0–36.0)
MCV: 86.3 fL (ref 78.0–100.0)
MONO ABS: 0.7 10*3/uL (ref 0.1–1.0)
MONOS PCT: 7 %
NEUTROS PCT: 53 %
Neutro Abs: 5.6 10*3/uL (ref 1.7–7.7)
PLATELETS: 242 10*3/uL (ref 150–400)
RBC: 4.45 MIL/uL (ref 4.22–5.81)
RDW: 14.5 % (ref 11.5–15.5)
WBC: 10.6 10*3/uL — ABNORMAL HIGH (ref 4.0–10.5)

## 2017-04-11 LAB — URINALYSIS, ROUTINE W REFLEX MICROSCOPIC
BILIRUBIN URINE: NEGATIVE
GLUCOSE, UA: NEGATIVE mg/dL
HGB URINE DIPSTICK: NEGATIVE
KETONES UR: NEGATIVE mg/dL
NITRITE: NEGATIVE
PH: 5 (ref 5.0–8.0)
Protein, ur: NEGATIVE mg/dL
SQUAMOUS EPITHELIAL / LPF: NONE SEEN
Specific Gravity, Urine: 1.029 (ref 1.005–1.030)

## 2017-04-11 LAB — COMPREHENSIVE METABOLIC PANEL
ALBUMIN: 4.1 g/dL (ref 3.5–5.0)
ALT: 10 U/L — ABNORMAL LOW (ref 17–63)
ANION GAP: 7 (ref 5–15)
AST: 14 U/L — ABNORMAL LOW (ref 15–41)
Alkaline Phosphatase: 67 U/L (ref 38–126)
BUN: 14 mg/dL (ref 6–20)
CO2: 26 mmol/L (ref 22–32)
Calcium: 9.4 mg/dL (ref 8.9–10.3)
Chloride: 103 mmol/L (ref 101–111)
Creatinine, Ser: 0.89 mg/dL (ref 0.61–1.24)
GFR calc non Af Amer: >60 mL/min (ref >60)
GLUCOSE: 101 mg/dL — AB (ref 65–99)
POTASSIUM: 4.2 mmol/L (ref 3.5–5.1)
SODIUM: 136 mmol/L (ref 135–145)
TOTAL PROTEIN: 6.6 g/dL (ref 6.5–8.1)
Total Bilirubin: 0.6 mg/dL (ref 0.3–1.2)

## 2017-04-11 LAB — GC/CHLAMYDIA PROBE AMP (~~LOC~~) NOT AT ARMC

## 2017-04-11 LAB — CK: Total CK: 91 U/L (ref 49–397)

## 2017-04-11 MED ORDER — NAPROXEN 500 MG PO TABS
500.0000 mg | ORAL_TABLET | Freq: Two times a day (BID) | ORAL | 0 refills | Status: DC
Start: 2017-04-11 — End: 2019-03-24

## 2017-04-11 MED ORDER — CLINDAMYCIN HCL 300 MG PO CAPS: 300.0000 mg | ORAL_CAPSULE | Freq: Three times a day (TID) | ORAL | 0 refills | Status: DC

## 2017-04-11 NOTE — ED Notes (Signed)
Pt ambulatory to bathroom without difficulty.  

## 2017-04-11 NOTE — ED Notes (Signed)
Pt requesting pain medicine, EDP aware 

## 2017-04-11 NOTE — Discharge Instructions (Signed)
Take antibiotic as prescribed for your teeth. Follow-up with the urologist for your bladder issues. Return to the ED if you develop new or worsening symptoms.

## 2017-04-11 NOTE — ED Notes (Signed)
ED Provider at bedside. 

## 2017-04-12 LAB — URINE CULTURE: Culture: NO GROWTH

## 2017-04-14 MED FILL — NAPROXEN 500 MG TABLET: 500 | 15 days supply | Qty: 30 | Fill #0

## 2017-04-14 MED FILL — CLINDAMYCIN HCL 300 MG CAP: 300 | 10 days supply | Qty: 30 | Fill #0

## 2017-04-20 ENCOUNTER — Inpatient Hospital Stay: Payer: Self-pay

## 2018-01-09 ENCOUNTER — Other Ambulatory Visit: Payer: Self-pay

## 2018-01-09 ENCOUNTER — Emergency Department (HOSPITAL_COMMUNITY)
Admission: EM | Admit: 2018-01-09 | Discharge: 2018-01-09 | Disposition: A | Discharge status: Home / Self Care | Payer: Self-pay | Attending: Emergency Medicine | Admitting: Emergency Medicine

## 2018-01-09 ENCOUNTER — Encounter (HOSPITAL_COMMUNITY): Payer: Self-pay

## 2018-01-09 DIAGNOSIS — F17210 Nicotine dependence, cigarettes, uncomplicated: Secondary | ICD-10-CM | POA: Insufficient documentation

## 2018-01-09 DIAGNOSIS — J45909 Unspecified asthma, uncomplicated: Secondary | ICD-10-CM | POA: Insufficient documentation

## 2018-01-09 DIAGNOSIS — K0889 Other specified disorders of teeth and supporting structures: Secondary | ICD-10-CM | POA: Insufficient documentation

## 2018-01-09 MED ORDER — CLINDAMYCIN HCL 300 MG PO CAPS: 300.0000 mg | ORAL_CAPSULE | Freq: Three times a day (TID) | ORAL | 0 refills | Status: DC

## 2018-01-09 MED ORDER — OXYCODONE-ACETAMINOPHEN 5-325 MG PO TABS
1.0000 | ORAL_TABLET | Freq: Once | ORAL | Status: AC
  Administered 2018-01-09: 1 via ORAL
  Filled 2018-01-09: qty 1

## 2018-01-09 MED ORDER — OXYCODONE-ACETAMINOPHEN 5-325 MG PO TABS: 1.0000 | ORAL_TABLET | Freq: Four times a day (QID) | ORAL | 0 refills | Status: DC | PRN

## 2018-01-09 NOTE — ED Triage Notes (Signed)
Pt endorses dental pain with several broken teeth on the left side x 3 months. Has appointment with dentist in 1.5 months. VSS.

## 2018-01-09 NOTE — ED Notes (Signed)
Called patient to move to Triage. Unable to locate patient at this time. 

## 2018-01-09 NOTE — ED Provider Notes (Signed)
[facility] EMERGENCY DEPARTMENT Provider Note   CSN: [id number] Arrival date & time: 01/09/18  1454     History   Chief Complaint Chief Complaint  Patient presents with  . Dental Pain    HPI Jeffrey Conway is a 27 y.o. male.  HPI   27 year old male presents today with complaints of dental pain.  Patient notes recent fracture of right upper third molar.  He notes significant pain to cold and touch.  He notes associated surrounding swelling.  Patient reports he has been appointment with a dentist but cannot see them for 1.5 months.  Patient denies any fever.  Past Medical History:  Diagnosis Date  . Anxiety   . Asthma   . Depression   . Shoulder dislocation, recurrent, right     There are no active problems to display for this patient.   History reviewed. No pertinent surgical history.     Home Medications    Prior to Admission medications   Medication Sig Start Date End Date Taking? Authorizing Provider  acetaminophen (TYLENOL) 500 MG tablet Take 1,500 mg by mouth every 4 (four) hours as needed for moderate pain.    [provider]  chlorhexidine gluconate, MEDLINE KIT, (PERIDEX) 0.12 % solution Use as directed 15 mLs in the mouth or throat 2 (two) times daily. 04/08/17   Carmin Muskrat, MD  clindamycin (CLEOCIN) 300 MG capsule Take 1 capsule (300 mg total) by mouth 3 (three) times daily. 01/09/18   Hedges, Dellis Filbert, PA-C  LORazepam (ATIVAN) 1 MG tablet Take 1 tablet (1 mg total) by mouth 3 (three) times daily as needed for anxiety. 04/08/17   Carmin Muskrat, MD  naproxen (NAPROSYN) 500 MG tablet Take 1 tablet (500 mg total) by mouth 2 (two) times daily. 04/11/17   Rancour, Annie Main, MD  oxyCODONE-acetaminophen (PERCOCET/ROXICET) 5-325 MG tablet Take 1 tablet by mouth every 6 (six) hours as needed for severe pain. 01/09/18   Okey Regal, PA-C    Family History Family History  Problem Relation Age of Onset  . Cancer Mother        skin    . Heart disease Mother   . Cancer Father        throat  . Heart disease Father   . Polycystic ovary syndrome Sister   . Mental illness Brother        depression as a teenager    Social History Social History   Tobacco Use  . Smoking status: Current Every Day Smoker    Packs/day: 1.00    Types: Cigarettes  . Smokeless tobacco: Never Used  Substance Use Topics  . Alcohol use: No    Alcohol/week: 0.0 oz  . Drug use: No     Allergies   Penicillins   Review of Systems Review of Systems  All other systems reviewed and are negative.  Physical Exam Updated Vital Signs BP 125/82   Pulse 63   Temp 98.3 F (36.8 C) (Oral)   Resp 17   Ht 5' 8" (1.727 m)   Wt 72.6 kg (160 lb)   SpO2 100%   BMI 24.33 kg/m   Physical Exam  Constitutional: He is oriented to person, place, and time. He appears well-developed and well-nourished.  HENT:  Head: Normocephalic and atraumatic.  Fracture tooth left third molar top no surrounding swelling, tenderness along the left lateral gumline no signs of deep space infection full active range of motion of the jaw  Eyes: Conjunctivae are normal. Pupils are  equal, round, and reactive to light. Right eye exhibits no discharge. Left eye exhibits no discharge. No scleral icterus.  Neck: Normal range of motion. Neck supple. No JVD present. No tracheal deviation present.  Pulmonary/Chest: Effort normal. No stridor.  Neurological: He is alert and oriented to person, place, and time. Coordination normal.  Psychiatric: He has a normal mood and affect. His behavior is normal. Judgment and thought content normal.  Nursing note and vitals reviewed.    ED Treatments / Results  Labs (all labs ordered are listed, but only abnormal results are displayed) Labs Reviewed - No data to display  EKG  EKG Interpretation None       Radiology No results found.  Procedures Procedures (including critical care time)  Medications Ordered in  ED Medications  oxyCODONE-acetaminophen (PERCOCET/ROXICET) 5-325 MG per tablet 1 tablet (1 tablet Oral Given 01/09/18 1831)     Initial Impression / Assessment and Plan / ED Course  I have reviewed the triage vital signs and the nursing notes.  Pertinent labs & imaging results that were available during my care of the patient were reviewed by me and considered in my medical decision making (see chart for details).     Final Clinical Impressions(s) / ED Diagnoses   Final diagnoses:  Pain, dental   Labs:   Imaging:  Consults:  Therapeutics:  Discharge Meds: Clindamycin, Percocet  Assessment/Plan: 27 year old male presents today with dental pain.  Subjective swelling no signs of deep space infection.  Patient concern for infection in this tooth, he will be started on clindamycin given he is allergic to penicillin.  Patient was given pain medication and resources for outpatient follow-up.  Return precautions given.  Patient verbalized understanding and agreement to today's plan had no further questions or concerns      ED Discharge Orders        Ordered    clindamycin (CLEOCIN) 300 MG capsule  3 times daily     01/09/18 1825    oxyCODONE-acetaminophen (PERCOCET/ROXICET) 5-325 MG tablet  Every 6 hours PRN     01/09/18 1825       Okey Regal, PA-C 01/09/18 2136    Daleen Bo, MD 01/11/18 [phone]

## 2018-01-09 NOTE — Discharge Instructions (Signed)
Please read attached information. If you experience any new or worsening signs or symptoms please return to the emergency room for evaluation. Please follow-up with your primary care provider or specialist as discussed. Please use medication prescribed only as directed and discontinue taking if you have any concerning signs or symptoms.   °

## 2019-03-24 ENCOUNTER — Encounter (HOSPITAL_COMMUNITY): Payer: Self-pay | Admitting: Emergency Medicine

## 2019-03-24 ENCOUNTER — Other Ambulatory Visit: Payer: Self-pay

## 2019-03-24 ENCOUNTER — Emergency Department (HOSPITAL_COMMUNITY)
Admission: EM | Admit: 2019-03-24 | Discharge: 2019-03-24 | Disposition: A | Discharge status: Home / Self Care | Payer: Self-pay | Attending: Emergency Medicine | Admitting: Emergency Medicine

## 2019-03-24 ENCOUNTER — Emergency Department (HOSPITAL_COMMUNITY): Payer: Self-pay

## 2019-03-24 DIAGNOSIS — K029 Dental caries, unspecified: Secondary | ICD-10-CM | POA: Insufficient documentation

## 2019-03-24 DIAGNOSIS — J45909 Unspecified asthma, uncomplicated: Secondary | ICD-10-CM | POA: Insufficient documentation

## 2019-03-24 DIAGNOSIS — W208XXA Other cause of strike by thrown, projected or falling object, initial encounter: Secondary | ICD-10-CM | POA: Insufficient documentation

## 2019-03-24 DIAGNOSIS — Z79899 Other long term (current) drug therapy: Secondary | ICD-10-CM | POA: Insufficient documentation

## 2019-03-24 DIAGNOSIS — F17210 Nicotine dependence, cigarettes, uncomplicated: Secondary | ICD-10-CM | POA: Insufficient documentation

## 2019-03-24 DIAGNOSIS — M546 Pain in thoracic spine: Secondary | ICD-10-CM | POA: Insufficient documentation

## 2019-03-24 MED ORDER — OXYCODONE HCL 5 MG PO TABS: 5.0000 mg | ORAL_TABLET | Freq: Four times a day (QID) | ORAL | 0 refills | Status: DC | PRN

## 2019-03-24 MED ORDER — OXYCODONE HCL 5 MG PO TABS: 5.0000 mg | ORAL_TABLET | Freq: Four times a day (QID) | ORAL | 0 refills | Status: AC | PRN

## 2019-03-24 MED ORDER — OXYCODONE HCL 5 MG PO TABS
10.0000 mg | ORAL_TABLET | Freq: Once | ORAL | Status: AC
  Administered 2019-03-24: 10 mg via ORAL
  Filled 2019-03-24: qty 2

## 2019-03-24 MED ORDER — METHOCARBAMOL 500 MG PO TABS
500.0000 mg | ORAL_TABLET | Freq: Once | ORAL | Status: AC
  Administered 2019-03-24: 500 mg via ORAL
  Filled 2019-03-24: qty 1

## 2019-03-24 MED ORDER — OXYCODONE-ACETAMINOPHEN 5-325 MG PO TABS: 2.0000 | ORAL_TABLET | Freq: Once | ORAL | Status: DC

## 2019-03-24 MED ORDER — NAPROXEN 500 MG PO TABS: 500.0000 mg | ORAL_TABLET | Freq: Two times a day (BID) | ORAL | 0 refills | Status: AC

## 2019-03-24 MED ORDER — CLINDAMYCIN HCL 150 MG PO CAPS: 450.0000 mg | ORAL_CAPSULE | Freq: Three times a day (TID) | ORAL | 0 refills | Status: AC

## 2019-03-24 MED ORDER — METHOCARBAMOL 500 MG PO TABS: 500.0000 mg | ORAL_TABLET | Freq: Two times a day (BID) | ORAL | 0 refills | Status: AC

## 2019-03-24 NOTE — Discharge Instructions (Addendum)
1. Medications: robaxin, naproxyn, oxycodone (only for severe pain), clindamycin, usual home medications; do not take any acetaminophen for 1 week.  After that you make take up to 1000mg  x4 per day but do not take more than 4000mg  in any 24 hour period. 2. Treatment: rest, drink plenty of fluids, gentle stretching as discussed, alternate ice and heat 3. Follow Up: Please followup with your primary doctor in 3-5 days for discussion of your diagnoses and further evaluation after today's visit; if you do not have a primary care doctor use the resource guide provided to find one;  Return to the ER for worsening back pain, difficulty walking, loss of bowel or bladder control or other concerning symptoms
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
## 2020-01-13 ENCOUNTER — Emergency Department (HOSPITAL_COMMUNITY): Payer: Self-pay

## 2020-01-13 ENCOUNTER — Emergency Department (HOSPITAL_COMMUNITY)
Admission: EM | Admit: 2020-01-13 | Discharge: 2020-01-13 | Disposition: A | Discharge status: Home / Self Care | Payer: Self-pay | Attending: Emergency Medicine | Admitting: Emergency Medicine

## 2020-01-13 ENCOUNTER — Other Ambulatory Visit: Payer: Self-pay

## 2020-01-13 ENCOUNTER — Encounter (HOSPITAL_COMMUNITY): Payer: Self-pay | Admitting: Emergency Medicine

## 2020-01-13 DIAGNOSIS — F17210 Nicotine dependence, cigarettes, uncomplicated: Secondary | ICD-10-CM | POA: Insufficient documentation

## 2020-01-13 DIAGNOSIS — J0110 Acute frontal sinusitis, unspecified: Secondary | ICD-10-CM | POA: Insufficient documentation

## 2020-01-13 DIAGNOSIS — Z20822 Contact with and (suspected) exposure to covid-19: Secondary | ICD-10-CM | POA: Insufficient documentation

## 2020-01-13 DIAGNOSIS — J45909 Unspecified asthma, uncomplicated: Secondary | ICD-10-CM | POA: Insufficient documentation

## 2020-01-13 DIAGNOSIS — Z79899 Other long term (current) drug therapy: Secondary | ICD-10-CM | POA: Insufficient documentation

## 2020-01-13 LAB — SARS CORONAVIRUS 2 (TAT 6-24 HRS): SARS Coronavirus 2: NEGATIVE

## 2020-01-13 MED ORDER — DOXYCYCLINE HYCLATE 100 MG PO CAPS: 100.0000 mg | ORAL_CAPSULE | Freq: Two times a day (BID) | ORAL | 0 refills | Status: AC

## 2020-01-13 NOTE — Discharge Instructions (Signed)
Your symptoms are likely caused by a sinus infection, take antibiotics twice daily as directed.  You could have some allergic inflammation worsening her symptoms please take Zyrtec daily and use Flonase in both nostrils daily.  You can take Motrin or Tylenol to help with headaches and facial pressure.  Your chest x-ray did not show any pneumonia today.  You have a Covid test pending and you should quarantine at home until you receive your results, these should be back in about 2 to 3 days, you will receive a phone call if results are positive, you can view negative results online through MyChart.  Please follow the instructions on your paperwork today to set up your MyChart account.

## 2020-01-13 NOTE — ED Provider Notes (Signed)
[facility] EMERGENCY DEPARTMENT Provider Note   CSN: [id number] Arrival date & time: 01/13/20  1357     History Chief Complaint  Patient presents with  . Nasal Congestion  . Headache    Jeffrey Conway is a 29 y.o. male.  Jeffrey Conway is a 29 y.o. male with a history of asthma, anxiety, depression, who presents to the ED for evaluation of greater than 1 week of nasal congestion, sinus pressure and pain, headaches, and productive cough.  Patient denies associated fevers or chills.  Denies sore throat.  Reports he is coughed up mucus intermittently and has had thick green nasal drainage that has been worsening over the past few days.  Reports frontal headache from sinus pressure.  No associated chest pain or shortness of breath.  No abdominal pain, vomiting or diarrhea.  He denies any known Covid contacts has not had Covid testing.  Has not yet had his Covid vaccine.  States he is taken NyQuil with minimal improvement has not tried anything else to treat his symptoms.        Past Medical History:  Diagnosis Date  . Anxiety   . Asthma   . Depression   . Shoulder dislocation, recurrent, right     There are no problems to display for this patient.   History reviewed. No pertinent surgical history.     Family History  Problem Relation Age of Onset  . Cancer Mother        skin  . Heart disease Mother   . Cancer Father        throat  . Heart disease Father   . Polycystic ovary syndrome Sister   . Mental illness Brother        depression as a teenager    Social History   Tobacco Use  . Smoking status: Current Every Day Smoker    Packs/day: 1.00    Types: Cigarettes  . Smokeless tobacco: Never Used  Substance Use Topics  . Alcohol use: No    Alcohol/week: 0.0 standard drinks  . Drug use: No    Home Medications Prior to Admission medications   Medication Sig Start Date End Date Taking? Authorizing Provider  acetaminophen (TYLENOL) 325 MG  tablet Take 1,300 mg by mouth every 6 (six) hours as needed for moderate pain.    [provider]  chlorhexidine gluconate, MEDLINE KIT, (PERIDEX) 0.12 % solution Use as directed 15 mLs in the mouth or throat 2 (two) times daily. 04/08/17   Carmin Muskrat, MD  clindamycin (CLEOCIN) 150 MG capsule Take 3 capsules (450 mg total) by mouth 3 (three) times daily. 03/24/19   Muthersbaugh, Jarrett Soho, PA-C  doxycycline (VIBRAMYCIN) 100 MG capsule Take 1 capsule (100 mg total) by mouth 2 (two) times daily. One po bid x 7 days 01/13/20   Jacqlyn Larsen, PA-C  ibuprofen (ADVIL) 200 MG tablet Take 1,000 mg by mouth every 6 (six) hours as needed for moderate pain.    [provider]  methocarbamol (ROBAXIN) 500 MG tablet Take 1 tablet (500 mg total) by mouth 2 (two) times daily. 03/24/19   Muthersbaugh, Jarrett Soho, PA-C  naproxen (NAPROSYN) 500 MG tablet Take 1 tablet (500 mg total) by mouth 2 (two) times daily with a meal. 03/24/19   Muthersbaugh, Jarrett Soho, PA-C  oxyCODONE (ROXICODONE) 5 MG immediate release tablet Take 1 tablet (5 mg total) by mouth every 6 (six) hours as needed for severe pain. 03/24/19   Muthersbaugh, Jarrett Soho, PA-C  vitamin B-12 (  CYANOCOBALAMIN) 1000 MCG tablet Take 1,000 mcg by mouth daily.    [provider]    Allergies    Penicillins  Review of Systems   Review of Systems  Constitutional: Negative for chills and fever.  HENT: Positive for congestion, rhinorrhea, sinus pressure and sinus pain. Negative for sore throat.   Respiratory: Positive for cough. Negative for shortness of breath.   Cardiovascular: Negative for chest pain.  Gastrointestinal: Negative for abdominal pain, diarrhea, nausea and vomiting.  Musculoskeletal: Negative for arthralgias and myalgias.  Skin: Negative for color change and rash.  Neurological: Positive for headaches.    Physical Exam Updated Vital Signs BP 105/61 (BP Location: Left Arm)   Pulse 89   Temp 97.9 F (36.6 C) (Oral)   Resp  16   SpO2 98%   Physical Exam Vitals and nursing note reviewed.  Constitutional:      General: He is not in acute distress.    Appearance: Normal appearance. He is well-developed. He is not ill-appearing or diaphoretic.  HENT:     Head: Normocephalic and atraumatic.     Nose: Congestion and rhinorrhea present.     Comments: Purulent rhinorrhea noted in bilateral nares with erythematous nasal mucosa, tenderness to palpation primarily over the frontal sinus    Mouth/Throat:     Comments: Posterior oropharynx is clear and moist without erythema, edema or exudates Eyes:     General:        Right eye: No discharge.        Left eye: No discharge.  Neck:     Comments: No rigidity Cardiovascular:     Rate and Rhythm: Normal rate and regular rhythm.     Heart sounds: Normal heart sounds. No murmur. No friction rub. No gallop.   Pulmonary:     Effort: Pulmonary effort is normal. No respiratory distress.     Breath sounds: Normal breath sounds.     Comments: Respirations equal and unlabored, patient able to speak in full sentences, lungs clear to auscultation bilaterally Abdominal:     General: Bowel sounds are normal. There is no distension.     Palpations: Abdomen is soft. There is no mass.     Tenderness: There is no abdominal tenderness. There is no guarding.     Comments: Abdomen soft, nondistended, nontender to palpation in all quadrants without guarding or peritoneal signs  Musculoskeletal:        General: No deformity.     Cervical back: Neck supple.  Lymphadenopathy:     Cervical: No cervical adenopathy.  Skin:    General: Skin is warm and dry.     Capillary Refill: Capillary refill takes less than 2 seconds.  Neurological:     Mental Status: He is alert and oriented to person, place, and time.  Psychiatric:        Mood and Affect: Mood normal.        Behavior: Behavior normal.     ED Results / Procedures / Treatments   Labs (all labs ordered are listed, but only  abnormal results are displayed) Labs Reviewed  SARS CORONAVIRUS 2 (TAT 6-24 HRS)    EKG None  Radiology DG Chest Port 1 View  Result Date: 01/13/2020 CLINICAL DATA:  C/o possible sinus infection. Pressure around face and headache. EXAM: PORTABLE CHEST 1 VIEW COMPARISON:  07/13/2012 FINDINGS: The heart size and mediastinal contours are within normal limits. Both lungs are clear. The visualized skeletal structures are unremarkable. IMPRESSION: No active disease. Electronically  Signed   By: Nolon Nations M.D.   On: 01/13/2020 15:44    Procedures Procedures (including critical care time)  Medications Ordered in ED Medications - No data to display  ED Course  I have reviewed the triage vital signs and the nursing notes.  Pertinent labs & imaging results that were available during my care of the patient were reviewed by me and considered in my medical decision making (see chart for details).    MDM Rules/Calculators/A&P                     Patient complaining of symptoms of sinusitis.    Severe symptoms have been present for greater than 1 week with purulent nasal discharge and frontal sinus pain.  Concern for acute bacterial rhinosinusitis.  Productive cough reported but patient's chest x-ray is clear, suspect this is due to postnasal drip.  Covid test pending.  Patient discharged with Augmentin.  Instructions given for warm saline nasal wash, encourage patient to use Flonase and Zyrtec to help with any allergic component and recommendations for follow-up with primary care physician.  Alson Mcpheeters was evaluated in Emergency Department on 01/13/2020 for the symptoms described in the history of present illness. He was evaluated in the context of the global COVID-19 pandemic, which necessitated consideration that the patient might be at risk for infection with the SARS-CoV-2 virus that causes COVID-19. Institutional protocols and algorithms that pertain to the evaluation of patients at  risk for COVID-19 are in a state of rapid change based on information released by regulatory bodies including the CDC and federal and state organizations. These policies and algorithms were followed during the patient's care in the ED.   Final Clinical Impression(s) / ED Diagnoses Final diagnoses:  Acute non-recurrent frontal sinusitis    Rx / DC Orders ED Discharge Orders         Ordered    doxycycline (VIBRAMYCIN) 100 MG capsule  2 times daily     01/13/20 1554           Jacqlyn Larsen, [place] 01/13/20 1614    Mesner, Corene Cornea, MD 01/14/20 [phone]

## 2020-01-13 NOTE — ED Triage Notes (Signed)
C/o nasal congestion, "sinus" headache, and cough x 1 week.  No known COVID exposures.

## 2020-01-13 NOTE — ED Notes (Signed)
Patient verbalizes understanding of discharge instructions. Opportunity for questioning and answers were provided. Armband removed by staff, pt discharged from ED.  

## 2020-05-25 DEATH — deceased
